# Patient Record
Sex: Female | Born: 1965 | Race: Black or African American | Hispanic: No | Marital: Married | Smoking: Never smoker
Health system: Southern US, Community
[De-identification: ages and names within clinical notes are randomized; demographics above are authoritative.]

## PROBLEM LIST (undated history)

## (undated) DIAGNOSIS — K439 Ventral hernia without obstruction or gangrene: Secondary | ICD-10-CM

## (undated) DIAGNOSIS — D649 Anemia, unspecified: Secondary | ICD-10-CM

## (undated) DIAGNOSIS — D219 Benign neoplasm of connective and other soft tissue, unspecified: Secondary | ICD-10-CM

## (undated) DIAGNOSIS — A3790 Whooping cough, unspecified species without pneumonia: Secondary | ICD-10-CM

## (undated) HISTORY — DX: Whooping cough, unspecified species without pneumonia: A37.90

## (undated) HISTORY — DX: Ventral hernia without obstruction or gangrene: K43.9

## (undated) HISTORY — DX: Benign neoplasm of connective and other soft tissue, unspecified: D21.9

## (undated) HISTORY — PX: COLONOSCOPY: SHX174

## (undated) HISTORY — PX: WISDOM TOOTH EXTRACTION: SHX21

---

## 1999-08-22 ENCOUNTER — Other Ambulatory Visit: Admission: RE | Admit: 1999-08-22 | Discharge: 1999-08-22 | Payer: Self-pay | Admitting: Obstetrics

## 2001-01-01 ENCOUNTER — Other Ambulatory Visit: Admission: RE | Admit: 2001-01-01 | Discharge: 2001-01-01 | Payer: Self-pay | Admitting: Obstetrics

## 2002-03-04 ENCOUNTER — Emergency Department (HOSPITAL_COMMUNITY): Admission: EM | Admit: 2002-03-04 | Discharge: 2002-03-04 | Payer: Self-pay | Admitting: Emergency Medicine

## 2002-03-04 ENCOUNTER — Encounter: Payer: Self-pay | Admitting: Emergency Medicine

## 2003-11-08 ENCOUNTER — Other Ambulatory Visit: Admission: RE | Admit: 2003-11-08 | Discharge: 2003-11-08 | Payer: Self-pay | Admitting: Family Medicine

## 2005-01-10 ENCOUNTER — Other Ambulatory Visit: Admission: RE | Admit: 2005-01-10 | Discharge: 2005-01-10 | Payer: Self-pay | Admitting: Family Medicine

## 2006-08-01 ENCOUNTER — Other Ambulatory Visit: Admission: RE | Admit: 2006-08-01 | Discharge: 2006-08-01 | Payer: Self-pay | Admitting: Family Medicine

## 2007-03-16 ENCOUNTER — Emergency Department (HOSPITAL_COMMUNITY): Admission: EM | Admit: 2007-03-16 | Discharge: 2007-03-16 | Payer: Self-pay | Admitting: Emergency Medicine

## 2007-07-29 ENCOUNTER — Other Ambulatory Visit: Admission: RE | Admit: 2007-07-29 | Discharge: 2007-07-29 | Payer: Self-pay | Admitting: Family Medicine

## 2008-01-28 ENCOUNTER — Encounter: Admission: RE | Admit: 2008-01-28 | Discharge: 2008-01-28 | Payer: Self-pay | Admitting: Gastroenterology

## 2008-10-26 ENCOUNTER — Encounter: Admission: RE | Admit: 2008-10-26 | Discharge: 2008-10-26 | Payer: Self-pay | Admitting: Family Medicine

## 2008-11-08 ENCOUNTER — Other Ambulatory Visit: Admission: RE | Admit: 2008-11-08 | Discharge: 2008-11-08 | Payer: Self-pay | Admitting: Family Medicine

## 2009-10-20 ENCOUNTER — Emergency Department (HOSPITAL_COMMUNITY): Admission: EM | Admit: 2009-10-20 | Discharge: 2009-10-20 | Payer: Self-pay | Admitting: Emergency Medicine

## 2010-01-19 ENCOUNTER — Other Ambulatory Visit: Admission: RE | Admit: 2010-01-19 | Discharge: 2010-01-19 | Payer: Self-pay | Admitting: Family Medicine

## 2011-01-14 ENCOUNTER — Encounter: Payer: Self-pay | Admitting: Family Medicine

## 2011-03-29 LAB — POCT CARDIAC MARKERS
CKMB, poc: 1.3 ng/mL (ref 1.0–8.0)
CKMB, poc: <1 ng/mL — ABNORMAL LOW (ref 1.0–8.0)
Myoglobin, poc: 68.6 ng/mL (ref 12–200)
Troponin i, poc: <0.05 ng/mL (ref 0.00–0.09)
Troponin i, poc: <0.05 ng/mL (ref 0.00–0.09)

## 2011-03-29 LAB — POCT I-STAT, CHEM 8
BUN: 14 mg/dL (ref 6–23)
Calcium, Ion: 1.26 mmol/L (ref 1.12–1.32)
Glucose, Bld: 100 mg/dL — ABNORMAL HIGH (ref 70–99)
Sodium: 140 mEq/L (ref 135–145)

## 2011-03-29 LAB — DIFFERENTIAL
Eosinophils Relative: 3 % (ref 0–5)
Lymphocytes Relative: 49 % — ABNORMAL HIGH (ref 12–46)
Lymphs Abs: 3.7 10*3/uL (ref 0.7–4.0)
Monocytes Relative: 6 % (ref 3–12)
Neutrophils Relative %: 42 % — ABNORMAL LOW (ref 43–77)

## 2011-03-29 LAB — URINALYSIS, ROUTINE W REFLEX MICROSCOPIC
Bilirubin Urine: NEGATIVE
Glucose, UA: NEGATIVE mg/dL
Hgb urine dipstick: NEGATIVE
Ketones, ur: NEGATIVE mg/dL
Nitrite: NEGATIVE
Protein, ur: NEGATIVE mg/dL
Specific Gravity, Urine: 1.015 (ref 1.005–1.030)
pH: 5.5 (ref 5.0–8.0)

## 2011-03-29 LAB — CBC
HCT: 32.5 % — ABNORMAL LOW (ref 36.0–46.0)
Hemoglobin: 10.7 g/dL — ABNORMAL LOW (ref 12.0–15.0)
MCV: 82.5 fL (ref 78.0–100.0)
Platelets: 270 10*3/uL (ref 150–400)

## 2011-04-27 ENCOUNTER — Ambulatory Visit (HOSPITAL_COMMUNITY)
Admission: RE | Admit: 2011-04-27 | Payer: Managed Care, Other (non HMO) | Source: Ambulatory Visit | Admitting: General Surgery

## 2012-08-19 ENCOUNTER — Other Ambulatory Visit: Payer: Self-pay | Admitting: Family Medicine

## 2012-08-19 ENCOUNTER — Other Ambulatory Visit (HOSPITAL_COMMUNITY)
Admission: RE | Admit: 2012-08-19 | Discharge: 2012-08-19 | Disposition: A | Discharge status: Home / Self Care | Payer: Managed Care, Other (non HMO) | Source: Ambulatory Visit | Attending: Family Medicine | Admitting: Family Medicine

## 2012-08-19 DIAGNOSIS — Z0000 Encounter for general adult medical examination without abnormal findings: Secondary | ICD-10-CM | POA: Insufficient documentation

## 2012-09-18 ENCOUNTER — Encounter (HOSPITAL_COMMUNITY): Payer: Self-pay | Admitting: Emergency Medicine

## 2012-09-18 ENCOUNTER — Emergency Department (HOSPITAL_COMMUNITY)
Admission: EM | Admit: 2012-09-18 | Discharge: 2012-09-18 | Disposition: A | Discharge status: Home / Self Care | Payer: Managed Care, Other (non HMO) | Attending: Emergency Medicine | Admitting: Emergency Medicine

## 2012-09-18 ENCOUNTER — Emergency Department (HOSPITAL_COMMUNITY): Payer: Managed Care, Other (non HMO)

## 2012-09-18 DIAGNOSIS — R0602 Shortness of breath: Secondary | ICD-10-CM | POA: Insufficient documentation

## 2012-09-18 DIAGNOSIS — R42 Dizziness and giddiness: Secondary | ICD-10-CM | POA: Insufficient documentation

## 2012-09-18 DIAGNOSIS — R079 Chest pain, unspecified: Secondary | ICD-10-CM | POA: Insufficient documentation

## 2012-09-18 HISTORY — DX: Anemia, unspecified: D64.9

## 2012-09-18 LAB — BASIC METABOLIC PANEL
BUN: 14 mg/dL (ref 6–23)
CO2: 25 mEq/L (ref 19–32)
Calcium: 9 mg/dL (ref 8.4–10.5)
Chloride: 100 mEq/L (ref 96–112)
Creatinine, Ser: 0.66 mg/dL (ref 0.50–1.10)
GFR calc Af Amer: >90 mL/min (ref >90)
GFR calc non Af Amer: >90 mL/min (ref >90)
Glucose, Bld: 99 mg/dL (ref 70–99)
Potassium: 4.1 mEq/L (ref 3.5–5.1)
Sodium: 135 mEq/L (ref 135–145)

## 2012-09-18 LAB — URINALYSIS, ROUTINE W REFLEX MICROSCOPIC
Bilirubin Urine: NEGATIVE
Glucose, UA: NEGATIVE mg/dL
Hgb urine dipstick: NEGATIVE
Ketones, ur: NEGATIVE mg/dL
Leukocytes, UA: NEGATIVE
Nitrite: NEGATIVE
Protein, ur: NEGATIVE mg/dL
Specific Gravity, Urine: 1.02 (ref 1.005–1.030)
Urobilinogen, UA: 0.2 mg/dL (ref 0.0–1.0)
pH: 8 (ref 5.0–8.0)

## 2012-09-18 LAB — CBC
HCT: 34.7 % — ABNORMAL LOW (ref 36.0–46.0)
Hemoglobin: 11.7 g/dL — ABNORMAL LOW (ref 12.0–15.0)
MCH: 26.6 pg (ref 26.0–34.0)
MCHC: 33.7 g/dL (ref 30.0–36.0)
MCV: 78.9 fL (ref 78.0–100.0)
Platelets: 335 10*3/uL (ref 150–400)
RBC: 4.4 MIL/uL (ref 3.87–5.11)
RDW: 12.7 % (ref 11.5–15.5)
WBC: 7.7 10*3/uL (ref 4.0–10.5)

## 2012-09-18 LAB — TROPONIN I: Troponin I: <0.3 ng/mL (ref <0.30)

## 2012-09-18 LAB — PRO B NATRIURETIC PEPTIDE: Pro B Natriuretic peptide (BNP): 8.2 pg/mL (ref 0–125)

## 2012-09-18 NOTE — ED Notes (Signed)
Pt presenting to ed with c/o chest pain onset yesterday morning pt denies nausea and vomiting pt denies radiating pain at this time. Pt states she does have intermittent shortness of breath. Pt states positive dizziness

## 2012-09-18 NOTE — ED Provider Notes (Signed)
History     CSN: [id number]  Arrival date & time 09/18/12  1748   First MD Initiated Contact with Patient 09/18/12 1909      Chief Complaint  Patient presents with  . Chest Pain    (Consider location/radiation/quality/duration/timing/severity/associated sxs/prior treatment) HPI Comments: Patient reports that she has had chest pain since yesterday afternoon.  She reports that the pain is constant.  Pain is located to the left of the sternum in the area of the left breast.  She states that the pain does not radiate.  She describes the pain as a pressure.  She has taken TUMS for her symptoms, which she reports has helped with the pain.  She also reports that the pain improves when she belches.  Nothing makes the pain worse.  She states that she has had intermittent dizziness and shortness of breath with the pain, but only when she really concentrates on the pain.  She denies diaphoresis, numbness, tingling, nausea, or vomiting.  No prior cardiac history.  No history of HTN, hyperlipidemia, or DM.  No FH of cardiac disease. She has never had a cardiac stress test in the past. She denies history of PE or DVT.  Denies any recent surgeries in the past 4 weeks.  No LE edema or pain.  No prolonged travel in the past 4 weeks.  No prior history of cancer.  She is currently not on any estrogen containing medications.    The history is provided by the patient.    Past Medical History  Diagnosis Date  . Anemia     History reviewed. No pertinent past surgical history.  No family history on file.  History  Substance Use Topics  . Smoking status: Never Smoker   . Smokeless tobacco: Not on file  . Alcohol Use: No    OB History    Grav Para Term Preterm Abortions TAB SAB Ect Mult Living                  Review of Systems  Constitutional: Negative for fever, chills and diaphoresis.  Respiratory: Positive for shortness of breath. Negative for cough.   Cardiovascular: Positive for chest pain.  Negative for palpitations and leg swelling.  Gastrointestinal: Negative for nausea, vomiting and abdominal pain.  Skin: Negative for rash.  Neurological: Positive for dizziness. Negative for syncope and numbness.    Allergies  Codeine and Hydrocodone  Home Medications   Current Outpatient Rx  Name Route Sig Dispense Refill  . IBUPROFEN 100 MG PO TABS Oral Take 100 mg by mouth every 6 (six) hours as needed. For pain      BP 114/57  Pulse 80  Temp 97.5 F (36.4 C) (Oral)  Resp 18  SpO2 100%  LMP 08/27/2012  Physical Exam  Nursing note and vitals reviewed. Constitutional: She appears well-developed and well-nourished. No distress.  HENT:  Head: Normocephalic and atraumatic.  Mouth/Throat: Oropharynx is clear and moist.  Neck: Normal range of motion. Neck supple.  Cardiovascular: Normal rate, regular rhythm, normal heart sounds and intact distal pulses.   Pulmonary/Chest: Effort normal and breath sounds normal. No respiratory distress. She has no wheezes. She has no rales.  Abdominal: Soft. There is no tenderness.  Musculoskeletal: Normal range of motion. She exhibits no edema.  Neurological: She is alert.  Skin: Skin is warm and dry. No rash noted. She is not diaphoretic.  Psychiatric: She has a normal mood and affect.    ED Course  Procedures (including critical  care time)  Labs Reviewed  CBC - Abnormal; Notable for the following:    Hemoglobin 11.7 (*)     HCT 34.7 (*)     All other components within normal limits  URINALYSIS, ROUTINE W REFLEX MICROSCOPIC - Abnormal; Notable for the following:    APPearance CLOUDY (*)     All other components within normal limits  BASIC METABOLIC PANEL  PRO B NATRIURETIC PEPTIDE  TROPONIN I  POCT PREGNANCY, URINE   Dg Chest Port 1 View  09/18/2012  *RADIOLOGY REPORT*  Clinical Data: Chest pain  PORTABLE CHEST - 1 VIEW  Comparison: 10/20/2009  Findings: Cardiomediastinal silhouette is stable.  No acute infiltrate or pleural  effusion.  No pulmonary edema.  Bony thorax is stable.  IMPRESSION: No active disease.  No significant change.   Original Report Authenticated By: Natasha Mead, M.D.      No diagnosis found.   Date: 09/18/2012  Rate: 79  Rhythm: normal sinus rhythm  QRS Axis: normal  Intervals: normal  ST/T Wave abnormalities: normal  Conduction Disutrbances:none  Narrative Interpretation:   Old EKG Reviewed: unchanged  Patient discussed with Dr. Juleen China who also evaluated the patient.  MDM  Patient is to be discharged with recommendation to follow up with PCP in regards to today's hospital visit. Chest pain is not likely of cardiac or pulmonary etiology d/t presentation, perc negative, VSS, no new murmur, RRR, breath sounds equal bilaterally, EKG without acute abnormalities, negative troponin, and negative CXR.  Return precautions discussed with patient.  Pt appears reliable for follow up and is agreeable to discharge.  Patient instructed to follow up with PCP to schedule a cardiac stress test.  Case has been discussed with and seen by Dr. Juleen China who agrees with the above plan to discharge.         Pascal Lux [place], PA-C 09/18/12 2141

## 2012-09-18 NOTE — ED Notes (Signed)
PA at bedside.

## 2012-09-21 NOTE — ED Provider Notes (Signed)
Medical screening examination/treatment/procedure(s) were performed by non-physician practitioner and as supervising physician I was immediately available for consultation/collaboration.  Raeford Razor, MD 09/21/12 1025

## 2012-10-07 ENCOUNTER — Other Ambulatory Visit: Payer: Self-pay | Admitting: Family Medicine

## 2012-10-07 DIAGNOSIS — Z1231 Encounter for screening mammogram for malignant neoplasm of breast: Secondary | ICD-10-CM

## 2012-10-08 ENCOUNTER — Ambulatory Visit: Payer: Managed Care, Other (non HMO)

## 2013-01-16 ENCOUNTER — Ambulatory Visit
Admission: RE | Admit: 2013-01-16 | Discharge: 2013-01-16 | Disposition: A | Discharge status: Home / Self Care | Payer: Managed Care, Other (non HMO) | Source: Ambulatory Visit | Attending: Family Medicine | Admitting: Family Medicine

## 2013-01-16 ENCOUNTER — Other Ambulatory Visit: Payer: Self-pay | Admitting: Family Medicine

## 2013-01-16 DIAGNOSIS — N920 Excessive and frequent menstruation with regular cycle: Secondary | ICD-10-CM

## 2014-03-22 ENCOUNTER — Ambulatory Visit (INDEPENDENT_AMBULATORY_CARE_PROVIDER_SITE_OTHER): Payer: Managed Care, Other (non HMO) | Admitting: *Deleted

## 2014-03-22 VITALS — Ht 64.0 in | Wt 177.8 lb

## 2014-03-22 DIAGNOSIS — Z111 Encounter for screening for respiratory tuberculosis: Secondary | ICD-10-CM

## 2014-03-25 ENCOUNTER — Encounter: Payer: Self-pay | Admitting: *Deleted

## 2014-03-25 ENCOUNTER — Ambulatory Visit (INDEPENDENT_AMBULATORY_CARE_PROVIDER_SITE_OTHER): Payer: Managed Care, Other (non HMO) | Admitting: *Deleted

## 2014-03-25 DIAGNOSIS — Z111 Encounter for screening for respiratory tuberculosis: Secondary | ICD-10-CM

## 2014-03-25 LAB — TB SKIN TEST
INDURATION: 0 mm
TB SKIN TEST: NEGATIVE

## 2014-08-23 ENCOUNTER — Ambulatory Visit
Admission: RE | Admit: 2014-08-23 | Discharge: 2014-08-23 | Disposition: A | Discharge status: Home / Self Care | Payer: Managed Care, Other (non HMO) | Source: Ambulatory Visit

## 2014-08-23 ENCOUNTER — Other Ambulatory Visit: Payer: Self-pay

## 2014-08-23 DIAGNOSIS — Z1231 Encounter for screening mammogram for malignant neoplasm of breast: Secondary | ICD-10-CM

## 2014-08-24 ENCOUNTER — Ambulatory Visit: Payer: Managed Care, Other (non HMO)

## 2015-08-30 ENCOUNTER — Other Ambulatory Visit (HOSPITAL_COMMUNITY)
Admission: RE | Admit: 2015-08-30 | Discharge: 2015-08-30 | Disposition: A | Discharge status: Home / Self Care | Payer: Managed Care, Other (non HMO) | Source: Ambulatory Visit | Attending: Family Medicine | Admitting: Family Medicine

## 2015-08-30 ENCOUNTER — Other Ambulatory Visit: Payer: Self-pay | Admitting: Family Medicine

## 2015-08-30 DIAGNOSIS — Z124 Encounter for screening for malignant neoplasm of cervix: Secondary | ICD-10-CM | POA: Insufficient documentation

## 2015-08-31 LAB — CYTOLOGY - PAP

## 2016-03-29 ENCOUNTER — Other Ambulatory Visit: Payer: Self-pay

## 2016-03-29 DIAGNOSIS — Z1231 Encounter for screening mammogram for malignant neoplasm of breast: Secondary | ICD-10-CM

## 2016-04-05 ENCOUNTER — Ambulatory Visit
Admission: RE | Admit: 2016-04-05 | Discharge: 2016-04-05 | Disposition: A | Discharge status: Home / Self Care | Payer: Managed Care, Other (non HMO) | Source: Ambulatory Visit

## 2016-04-05 DIAGNOSIS — Z1231 Encounter for screening mammogram for malignant neoplasm of breast: Secondary | ICD-10-CM

## 2017-03-15 ENCOUNTER — Other Ambulatory Visit: Payer: Self-pay | Admitting: Family Medicine

## 2017-03-15 DIAGNOSIS — Z1231 Encounter for screening mammogram for malignant neoplasm of breast: Secondary | ICD-10-CM

## 2017-04-08 ENCOUNTER — Ambulatory Visit
Admission: RE | Admit: 2017-04-08 | Discharge: 2017-04-08 | Disposition: A | Discharge status: Home / Self Care | Payer: 59 | Source: Ambulatory Visit | Attending: Family Medicine | Admitting: Family Medicine

## 2017-04-08 DIAGNOSIS — Z1231 Encounter for screening mammogram for malignant neoplasm of breast: Secondary | ICD-10-CM

## 2018-02-12 ENCOUNTER — Other Ambulatory Visit: Payer: Self-pay | Admitting: Family Medicine

## 2018-02-12 DIAGNOSIS — N920 Excessive and frequent menstruation with regular cycle: Secondary | ICD-10-CM

## 2018-02-25 ENCOUNTER — Other Ambulatory Visit: Payer: 59

## 2018-03-20 ENCOUNTER — Other Ambulatory Visit: Payer: Self-pay | Admitting: Family Medicine

## 2018-03-20 DIAGNOSIS — Z1231 Encounter for screening mammogram for malignant neoplasm of breast: Secondary | ICD-10-CM

## 2018-04-11 ENCOUNTER — Ambulatory Visit
Admission: RE | Admit: 2018-04-11 | Discharge: 2018-04-11 | Disposition: A | Discharge status: Home / Self Care | Payer: 59 | Source: Ambulatory Visit | Attending: Family Medicine | Admitting: Family Medicine

## 2018-04-11 DIAGNOSIS — Z1231 Encounter for screening mammogram for malignant neoplasm of breast: Secondary | ICD-10-CM

## 2018-10-06 ENCOUNTER — Other Ambulatory Visit (HOSPITAL_COMMUNITY)
Admission: RE | Admit: 2018-10-06 | Discharge: 2018-10-06 | Disposition: A | Discharge status: Home / Self Care | Payer: 59 | Source: Ambulatory Visit | Attending: Family Medicine | Admitting: Family Medicine

## 2018-10-06 ENCOUNTER — Other Ambulatory Visit: Payer: Self-pay | Admitting: Family Medicine

## 2018-10-06 DIAGNOSIS — Z124 Encounter for screening for malignant neoplasm of cervix: Secondary | ICD-10-CM | POA: Insufficient documentation

## 2018-10-08 LAB — CYTOLOGY - PAP
DIAGNOSIS: NEGATIVE
HPV (WINDOPATH): NOT DETECTED

## 2018-11-11 ENCOUNTER — Other Ambulatory Visit: Payer: Self-pay | Admitting: Family Medicine

## 2018-11-11 DIAGNOSIS — K439 Ventral hernia without obstruction or gangrene: Secondary | ICD-10-CM

## 2018-11-15 ENCOUNTER — Ambulatory Visit
Admission: RE | Admit: 2018-11-15 | Discharge: 2018-11-15 | Disposition: A | Discharge status: Home / Self Care | Payer: 59 | Source: Ambulatory Visit | Attending: Family Medicine | Admitting: Family Medicine

## 2018-11-15 DIAGNOSIS — K439 Ventral hernia without obstruction or gangrene: Secondary | ICD-10-CM

## 2018-11-15 MED ORDER — IOPAMIDOL (ISOVUE-300) INJECTION 61%
100.0000 mL | Freq: Once | INTRAVENOUS | Status: AC | PRN
  Administered 2018-11-15: 100 mL via INTRAVENOUS

## 2019-04-21 ENCOUNTER — Other Ambulatory Visit: Payer: Self-pay | Admitting: Family Medicine

## 2019-04-21 DIAGNOSIS — Z1231 Encounter for screening mammogram for malignant neoplasm of breast: Secondary | ICD-10-CM

## 2019-04-23 ENCOUNTER — Telehealth: Payer: Self-pay | Admitting: Physical Medicine & Rehabilitation

## 2019-06-16 ENCOUNTER — Ambulatory Visit
Admission: RE | Admit: 2019-06-16 | Discharge: 2019-06-16 | Disposition: A | Discharge status: Home / Self Care | Payer: 59 | Source: Ambulatory Visit | Attending: Family Medicine | Admitting: Family Medicine

## 2019-06-16 DIAGNOSIS — Z1231 Encounter for screening mammogram for malignant neoplasm of breast: Secondary | ICD-10-CM

## 2020-04-01 ENCOUNTER — Ambulatory Visit: Payer: 59 | Attending: Internal Medicine

## 2020-04-02 ENCOUNTER — Ambulatory Visit: Payer: 59

## 2020-04-28 NOTE — Telephone Encounter (Signed)
Opened in error

## 2020-05-26 ENCOUNTER — Other Ambulatory Visit: Payer: Self-pay | Admitting: Family Medicine

## 2020-05-26 DIAGNOSIS — Z1231 Encounter for screening mammogram for malignant neoplasm of breast: Secondary | ICD-10-CM

## 2020-06-16 ENCOUNTER — Other Ambulatory Visit: Payer: Self-pay

## 2020-06-16 ENCOUNTER — Ambulatory Visit
Admission: RE | Admit: 2020-06-16 | Discharge: 2020-06-16 | Disposition: A | Discharge status: Home / Self Care | Payer: 59 | Source: Ambulatory Visit | Attending: Family Medicine | Admitting: Family Medicine

## 2020-06-16 DIAGNOSIS — Z1231 Encounter for screening mammogram for malignant neoplasm of breast: Secondary | ICD-10-CM

## 2020-06-21 ENCOUNTER — Other Ambulatory Visit: Payer: Self-pay | Admitting: Family Medicine

## 2020-06-21 DIAGNOSIS — R928 Other abnormal and inconclusive findings on diagnostic imaging of breast: Secondary | ICD-10-CM

## 2020-07-06 ENCOUNTER — Ambulatory Visit
Admission: RE | Admit: 2020-07-06 | Discharge: 2020-07-06 | Disposition: A | Discharge status: Home / Self Care | Payer: 59 | Source: Ambulatory Visit | Attending: Family Medicine | Admitting: Family Medicine

## 2020-07-06 ENCOUNTER — Other Ambulatory Visit: Payer: Self-pay

## 2020-07-06 DIAGNOSIS — R928 Other abnormal and inconclusive findings on diagnostic imaging of breast: Secondary | ICD-10-CM

## 2020-07-06 IMAGING — MG DIGITAL DIAGNOSTIC BILAT W/ TOMO W/ CAD
8 series · 8 of 24 positions shown · non-contrast
Comparison: Previous exam(s).

CLINICAL DATA: Screening recall for possible masses in each breast.

EXAM:
DIGITAL DIAGNOSTIC BILATERAL MAMMOGRAM WITH CAD AND TOMO
ULTRASOUND BILATERAL BREAST

[R MLO synth-2D]
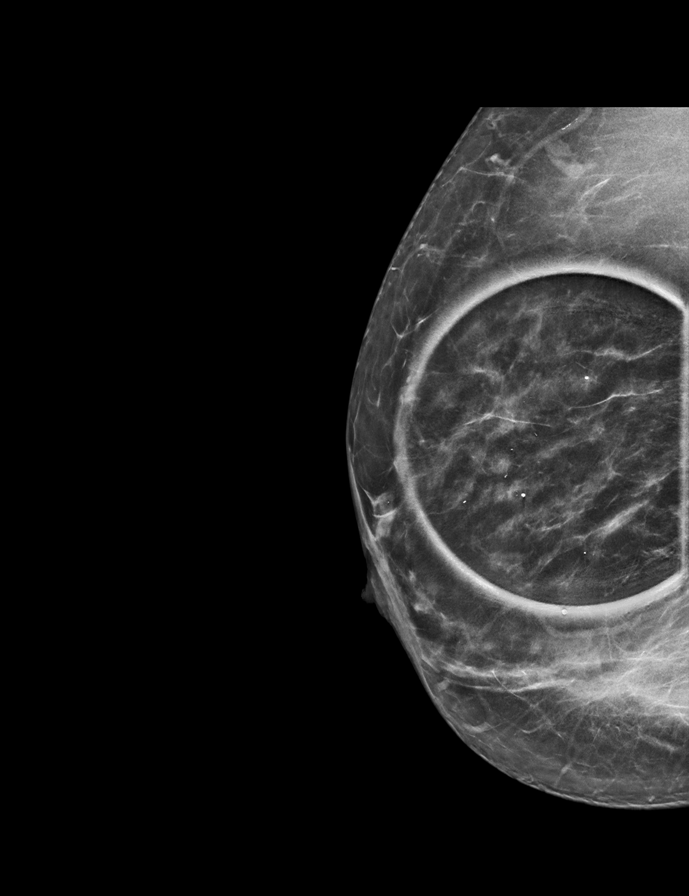

[L CC synth-2D]
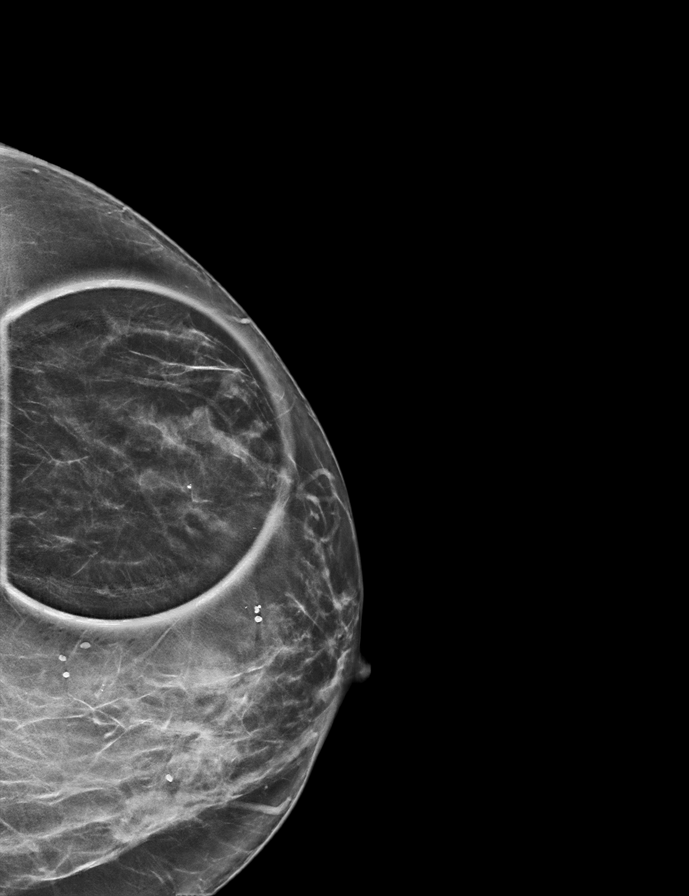

[R CC synth-2D]
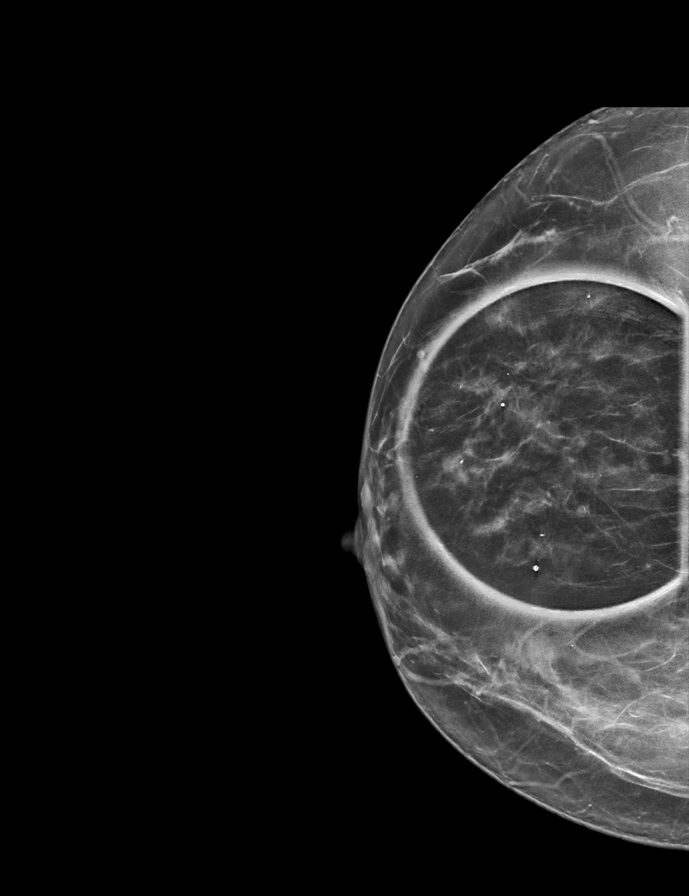

[L MLO synth-2D]
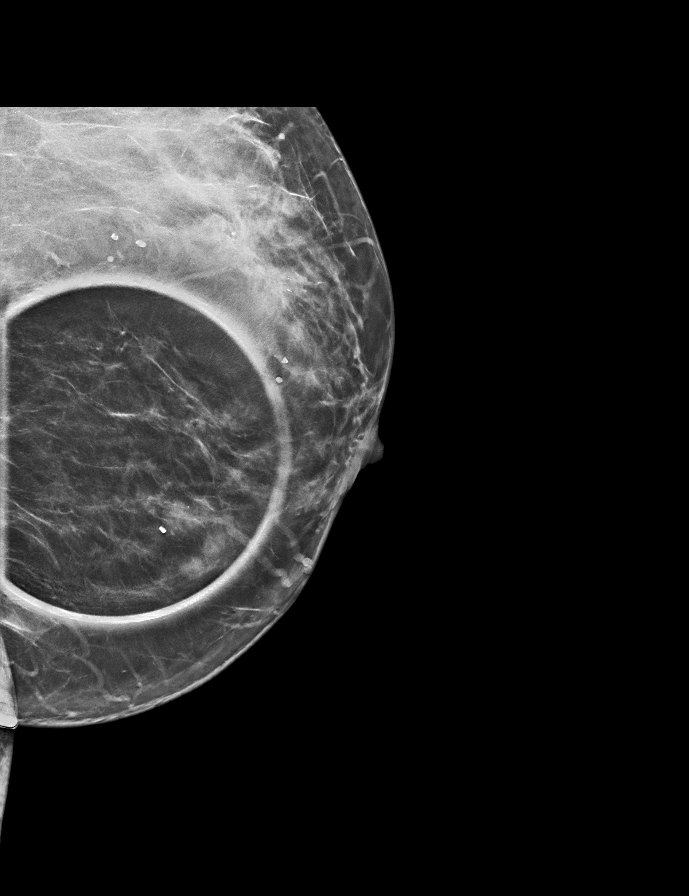

[L MLO tomo · tomo slice 30/59.0]
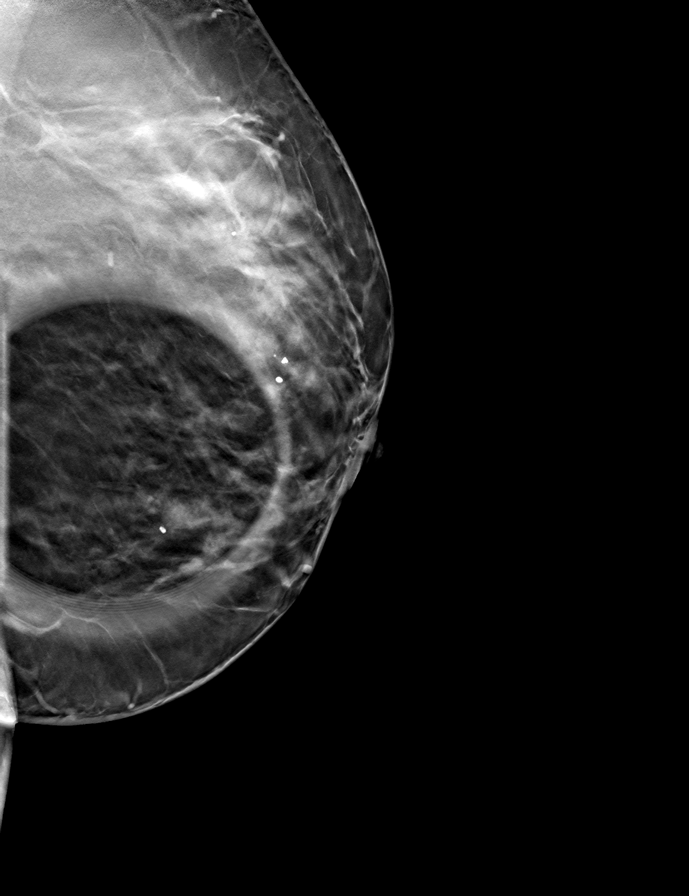

[R MLO tomo · tomo slice 32/63.0]
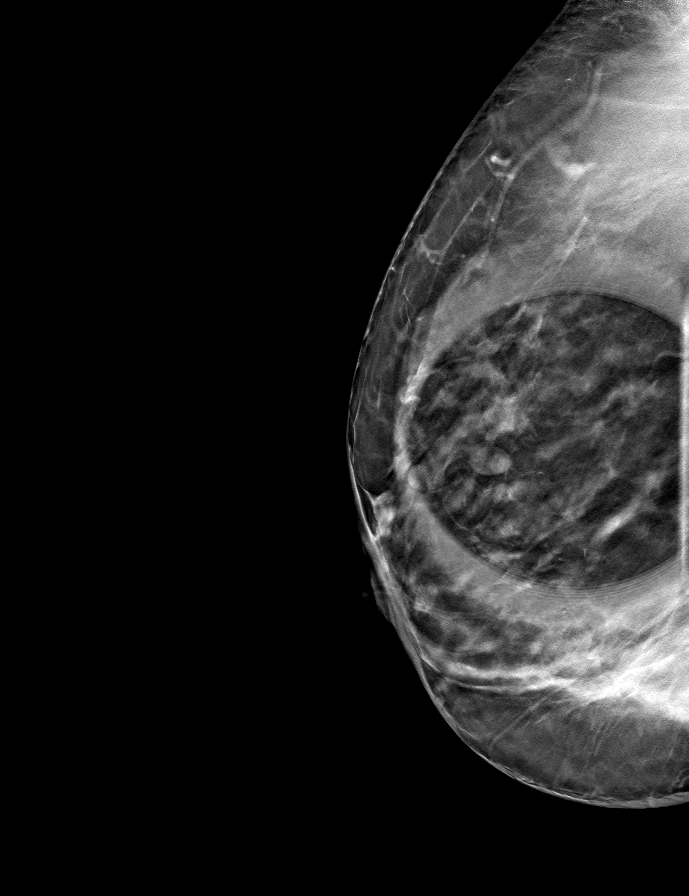

[L CC tomo · tomo slice 31/62.0]
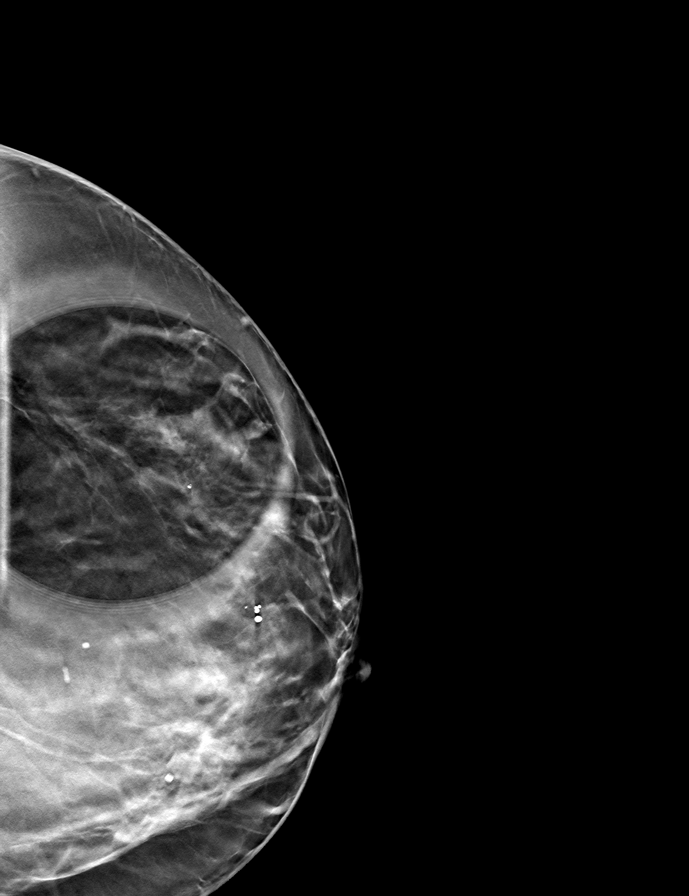

[R CC tomo · tomo slice 30/59.0]
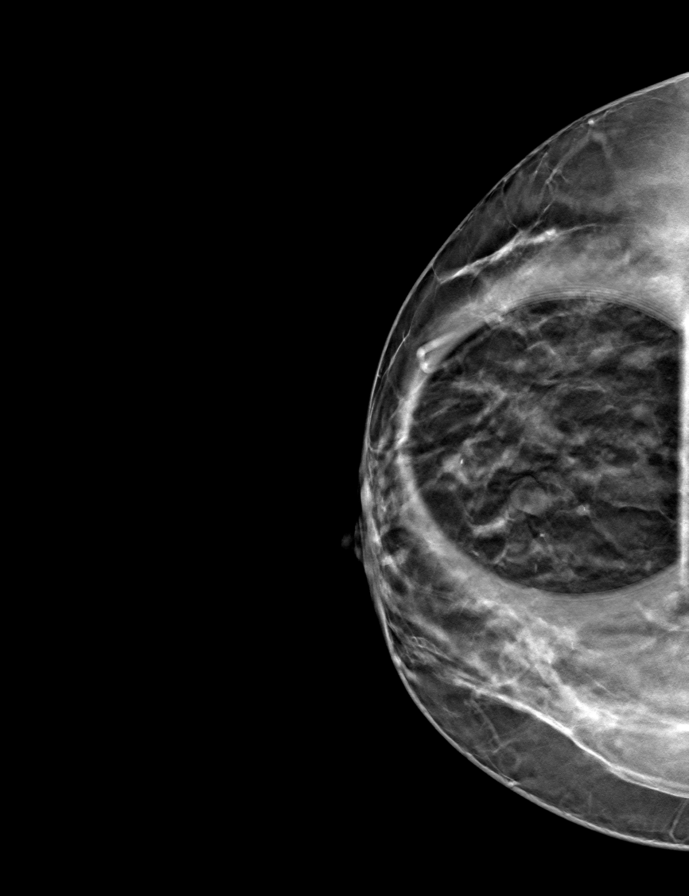

[8 of 24 positions shown; findings below may reference images not displayed]

ACR Breast Density Category c: The breast tissue is heterogeneously
dense, which may obscure small masses.
FINDINGS: In the right breast, the possible mass noted laterally, the anterior
to middle third, persists as a smoothly marginated oval,
approximately 8 mm mass.

In the left breast, possible mass noted laterally persists as a
lobulated, overall oval, mostly circumscribed mass in the
superficial breast, middle third depth.

Mammographic images were processed with CAD.

Targeted right breast ultrasound is performed, showing a dilated
duct that extends to a focal area of dilation at 11 o'clock, 1 cm
from the nipple, measuring 8 x 6 x 7 mm, consistent in size, shape
and location to the mammographic mass. There are no solid masses or
suspicious lesions.

Targeted left breast ultrasound is performed, showing a cluster of
cysts at 3:30 o'clock, 2 cm from the nipple, measuring 1.4 x 0.5 x
1.3 cm, consistent in size, shape and location to the mammographic
mass. There are no solid masses or suspicious lesions.
IMPRESSION: 1. No evidence of breast malignancy.
2. Benign right breast focally dilated duct and left breast cyst.

RECOMMENDATION:
Screening mammogram in one year.(Code:[XT])

I have discussed the findings and recommendations with the patient.
If applicable, a reminder letter will be sent to the patient
regarding the next appointment.

BI-RADS CATEGORY  2: Benign.

## 2020-11-04 ENCOUNTER — Other Ambulatory Visit: Payer: Self-pay | Admitting: Nurse Practitioner

## 2020-11-04 MED ORDER — CIPROFLOXACIN-DEXAMETHASONE 0.3-0.1 % OT SUSP: 4.0000 [drp] | Freq: Two times a day (BID) | OTIC | 0 refills | Status: DC

## 2021-01-12 ENCOUNTER — Other Ambulatory Visit (HOSPITAL_COMMUNITY): Payer: Self-pay | Admitting: Family Medicine

## 2021-01-12 ENCOUNTER — Ambulatory Visit (HOSPITAL_COMMUNITY)
Admission: RE | Admit: 2021-01-12 | Discharge: 2021-01-12 | Disposition: A | Discharge status: Home / Self Care | Payer: 59 | Source: Ambulatory Visit | Attending: Family Medicine | Admitting: Family Medicine

## 2021-01-12 ENCOUNTER — Other Ambulatory Visit: Payer: Self-pay

## 2021-01-12 DIAGNOSIS — R52 Pain, unspecified: Secondary | ICD-10-CM

## 2021-01-13 ENCOUNTER — Encounter (HOSPITAL_COMMUNITY): Payer: Self-pay

## 2021-01-13 ENCOUNTER — Emergency Department (HOSPITAL_COMMUNITY)
Admission: EM | Admit: 2021-01-13 | Discharge: 2021-01-13 | Disposition: A | Discharge status: Home / Self Care | Payer: 59 | Attending: Emergency Medicine | Admitting: Emergency Medicine

## 2021-01-13 ENCOUNTER — Other Ambulatory Visit: Payer: Self-pay

## 2021-01-13 ENCOUNTER — Emergency Department (HOSPITAL_COMMUNITY): Payer: 59

## 2021-01-13 DIAGNOSIS — R1013 Epigastric pain: Secondary | ICD-10-CM | POA: Diagnosis not present

## 2021-01-13 DIAGNOSIS — M791 Myalgia, unspecified site: Secondary | ICD-10-CM | POA: Diagnosis not present

## 2021-01-13 DIAGNOSIS — R0981 Nasal congestion: Secondary | ICD-10-CM | POA: Diagnosis not present

## 2021-01-13 DIAGNOSIS — J029 Acute pharyngitis, unspecified: Secondary | ICD-10-CM | POA: Diagnosis not present

## 2021-01-13 DIAGNOSIS — Z8616 Personal history of COVID-19: Secondary | ICD-10-CM | POA: Diagnosis not present

## 2021-01-13 DIAGNOSIS — R519 Headache, unspecified: Secondary | ICD-10-CM | POA: Diagnosis not present

## 2021-01-13 DIAGNOSIS — R509 Fever, unspecified: Secondary | ICD-10-CM | POA: Insufficient documentation

## 2021-01-13 DIAGNOSIS — R079 Chest pain, unspecified: Secondary | ICD-10-CM | POA: Diagnosis present

## 2021-01-13 DIAGNOSIS — R0789 Other chest pain: Secondary | ICD-10-CM | POA: Insufficient documentation

## 2021-01-13 DIAGNOSIS — R0602 Shortness of breath: Secondary | ICD-10-CM | POA: Diagnosis not present

## 2021-01-13 LAB — BASIC METABOLIC PANEL
Anion gap: 8 (ref 5–15)
BUN: 17 mg/dL (ref 6–20)
CO2: 28 mmol/L (ref 22–32)
Calcium: 9.8 mg/dL (ref 8.9–10.3)
Chloride: 103 mmol/L (ref 98–111)
Creatinine, Ser: 0.78 mg/dL (ref 0.44–1.00)
GFR, Estimated: >60 mL/min (ref >60)
Glucose, Bld: 103 mg/dL — ABNORMAL HIGH (ref 70–99)
Potassium: 4.8 mmol/L (ref 3.5–5.1)
Sodium: 139 mmol/L (ref 135–145)

## 2021-01-13 LAB — TROPONIN I (HIGH SENSITIVITY): Troponin I (High Sensitivity): <2 ng/L (ref <18)

## 2021-01-13 LAB — CBC
HCT: 37.9 % (ref 36.0–46.0)
Hemoglobin: 12.1 g/dL (ref 12.0–15.0)
MCH: 26.3 pg (ref 26.0–34.0)
MCHC: 31.9 g/dL (ref 30.0–36.0)
MCV: 82.4 fL (ref 80.0–100.0)
Platelets: 340 10*3/uL (ref 150–400)
RBC: 4.6 MIL/uL (ref 3.87–5.11)
RDW: 12.7 % (ref 11.5–15.5)
WBC: 7.8 10*3/uL (ref 4.0–10.5)
nRBC: 0 % (ref 0.0–0.2)

## 2021-01-13 LAB — I-STAT BETA HCG BLOOD, ED (MC, WL, AP ONLY): I-stat hCG, quantitative: <5 m[IU]/mL (ref <5)

## 2021-01-13 LAB — D-DIMER, QUANTITATIVE: D-Dimer, Quant: <0.27 ug/mL-FEU (ref 0.00–0.50)

## 2021-01-13 MED ORDER — LIDOCAINE VISCOUS HCL 2 % MT SOLN
15.0000 mL | Freq: Once | OROMUCOSAL | Status: AC
  Administered 2021-01-13: 15 mL via ORAL
  Filled 2021-01-13: qty 15

## 2021-01-13 MED ORDER — ALUM & MAG HYDROXIDE-SIMETH 200-200-20 MG/5ML PO SUSP
30.0000 mL | Freq: Once | ORAL | Status: AC
  Administered 2021-01-13: 30 mL via ORAL
  Filled 2021-01-13: qty 30

## 2021-01-13 NOTE — ED Provider Notes (Signed)
[facility] DEPT Provider Note   CSN: [id number] Arrival date & time: 01/13/21  1709     History Chief Complaint  Patient presents with  . Chest Pain    Gabrielle Berg is a 55 y.o. female.  The history is provided by the patient and medical records. No language interpreter was used.  Chest Pain    55 year old female significant history of anemia, recently diagnosed with COVID infection approximate 10 days ago presenting complaining of chest pain.  Patient reports she was diagnosed with COVID approximately 7 days ago.  Since the onset of her symptoms she has been having intermittent chest pain.  She described as a nagging sensation to her chest.  Pain is waxing and waning, sometimes taking a deep breath actually helps.  At time he could be reproducible.  Occasionally endorse some mild shortness of breath.  She did have fever chills body aches nasal congestion headache sore throat but most of those symptoms have since improved.  She admits to taking over-the-counter medication including ibuprofen twice daily as well as cough and cold medication.  She she mention most of the symptom has resolved except for her chest discomfort.  She was seen by her PCP yesterday for her complaints, she reported EKG was obtained as well as as a chest x-ray and she was told that it was normal.  She was prescribed omeprazole for 7 days.  She did take the medication which provide some improvement.  Today she noticed some discomfort in her left arm and decided to come here for evaluation.  She denies any prior history of PE or DVT.  She denies any strong family history of premature cardiac death.  No complaints of drug or alcohol use.  She was vaccinated for COVID-19 but have not had her booster.   Past Medical History:  Diagnosis Date  . Anemia   . Anemia   . Anemia   . Fibroid    small  fibroid on utrasound 1/14  . Pertussis    as a child  . Ventral hernia    Dr Barkley Bruns     There are no problems to display for this patient.   Past Surgical History:  Procedure Laterality Date  . WISDOM TOOTH EXTRACTION       OB History   No obstetric history on file.     Family History  Problem Relation Age of Onset  . Breast cancer Maternal Aunt 77    Social History   Tobacco Use  . Smoking status: Never Smoker  Substance Use Topics  . Alcohol use: No  . Drug use: No    Home Medications Prior to Admission medications   Medication Sig Start Date End Date Taking? Authorizing Provider  ciprofloxacin-dexamethasone (CIPRODEX) OTIC suspension Place 4 drops into the left ear 2 (two) times daily. 11/04/20   Vevelyn Francois, NP  ibuprofen (ADVIL,MOTRIN) 100 MG tablet Take 100 mg by mouth every 6 (six) hours as needed. For pain    [provider]    Allergies    Codeine and Hydrocodone  Review of Systems   Review of Systems  Cardiovascular: Positive for chest pain.  All other systems reviewed and are negative.   Physical Exam Updated Vital Signs BP (!) 152/86 (BP Location: Right Arm)   Pulse 88   Temp 98.2 F (36.8 C) (Oral)   Resp 17   SpO2 98%   Physical Exam Vitals and nursing note reviewed.  Constitutional:  General: She is not in acute distress.    Appearance: She is well-developed and well-nourished.     Comments: Patient laying in bed appears to be in no acute discomfort.  Speaks in complete sentences.  HENT:     Head: Atraumatic.  Eyes:     Conjunctiva/sclera: Conjunctivae normal.  Cardiovascular:     Rate and Rhythm: Normal rate and regular rhythm.     Heart sounds: Normal heart sounds. No murmur heard. No friction rub. No gallop.   Pulmonary:     Effort: Pulmonary effort is normal.     Breath sounds: Normal breath sounds. No wheezing, rhonchi or rales.  Chest:     Chest wall: Tenderness (Mild left anterior chest wall tenderness on palpation no crepitus emphysema noted.  No rash.) present.  Abdominal:      Palpations: Abdomen is soft.     Tenderness: There is no abdominal tenderness.  Musculoskeletal:     Cervical back: Neck supple.     Right lower leg: No edema.     Left lower leg: No edema.  Skin:    Findings: No rash.  Neurological:     Mental Status: She is alert and oriented to person, place, and time.  Psychiatric:        Mood and Affect: Mood and affect and mood normal.     ED Results / Procedures / Treatments   Labs (all labs ordered are listed, but only abnormal results are displayed) Labs Reviewed  BASIC METABOLIC PANEL - Abnormal; Notable for the following components:      Result Value   Glucose, Bld 103 (*)    All other components within normal limits  CBC  D-DIMER, QUANTITATIVE (NOT AT [facility])  I-STAT BETA HCG BLOOD, ED ([facility], [facility], [facility] ONLY)  TROPONIN I (HIGH SENSITIVITY)    EKG None  ED ECG REPORT   Date: 01/13/2021  Rate: 85  Rhythm: normal sinus rhythm  QRS Axis: left  Intervals: normal  ST/T Wave abnormalities: normal  Conduction Disutrbances:abnormal R wave progression, early transition.  LVH  Narrative Interpretation:   Old EKG Reviewed: unchanged  I have personally reviewed the EKG tracing and agree with the computerized printout as noted.   Radiology DG Chest 2 View  Result Date: 01/13/2021 CLINICAL DATA:  Chest pain for several days, pain radiating to left shoulder EXAM: CHEST - 2 VIEW COMPARISON:  01/12/2021 FINDINGS: Frontal and lateral views of the chest demonstrate an unremarkable cardiac silhouette. No airspace disease, effusion, or pneumothorax. No acute bony abnormalities. IMPRESSION: 1. No acute intrathoracic process. Electronically Signed   By: Randa Ngo M.D.   On: 01/13/2021 18:27   DG Chest 2 View  Result Date: 01/12/2021 CLINICAL DATA:  Chest pain EXAM: CHEST - 2 VIEW COMPARISON:  09/18/2012 FINDINGS: The heart size and mediastinal contours are within normal limits. Both lungs are clear. The visualized skeletal structures are  unremarkable. IMPRESSION: No active cardiopulmonary disease. Electronically Signed   By: Donavan Foil M.D.   On: 01/12/2021 19:53    Procedures Procedures (including critical care time)  Medications Ordered in ED Medications  alum & mag hydroxide-simeth (MAALOX/MYLANTA) 200-200-20 MG/5ML suspension 30 mL (30 mLs Oral Given 01/13/21 1830)    And  lidocaine (XYLOCAINE) 2 % viscous mouth solution 15 mL (15 mLs Oral Given 01/13/21 1830)    ED Course  I have reviewed the triage vital signs and the nursing notes.  Pertinent labs & imaging results that were available during my care of  the patient were reviewed by me and considered in my medical decision making (see chart for details).    MDM Rules/Calculators/A&P                          BP 137/85 (BP Location: Left Arm)   Pulse 77   Temp 98.1 F (36.7 C) (Oral)   Resp 18   SpO2 100%  Final Clinical Impression(s) / ED Diagnoses Final diagnoses:  Epigastric pain    Rx / DC Orders ED Discharge Orders    None     6:11 PM Patient recently had COVID infection is not is here complaining of chest discomfort.  Her pain is atypical of ACS.  Pain is nonexertional and she does not have any friction rub or EKG finding or troponin findings to suggest pericarditis or myocarditis.  She does voice concern for potential PE therefore D-dimer ordered as patient cannot be ruled out using PERC criteria.  I suspect her symptoms may be due to gastritis from persistent NSAIDs use or during her sickness.  GI cocktail given.  She was worried about her elevated blood pressure.  Blood pressure is 153/86.  Suspect this may be secondary to over-the-counter cough and cold medication which may include decongestant.  7:42 PM Labs are reassuring, negative D-dimer, low suspicion for PE, troponin is negative, chest x-ray unremarkable, essentially everything else seems to be within normal limit.  After receiving GI cocktail patient felt much better.  At this time she  is stable for discharge.  Encourage patient to take Prilosec as previously prescribed.  Return precaution given.   Domenic Moras, PA-C 01/13/21 2050    Drenda Freeze, MD 01/13/21 [phone]

## 2021-01-13 NOTE — Discharge Instructions (Addendum)
Your pain discomfort is likely due to irritation of the stomach lining from regular use of NSAIDs.  I would recommend taking Prilosec as previously prescribed.  Your elevated blood pressure may be due to over-the-counter cold and cough medication that contains decongestion.  I anticipate your blood pressure will improve with time.  Recommendations for at home COVID-19 symptoms management:  Please continue isolation at home. Call [phone] to see whether you might be eligible for therapeutic antibody infusions if your symptoms are within 5-7 days. (leave your name and they will call you back).  If have acute worsening of symptoms please go to ER/urgent care for further evaluation. Check pulse oximetry and if below 90-92% please go to ER. The following supplements MAY help:  Vitamin C 500mg  twice a day and Quercetin 250-500 mg twice a day Vitamin D3 2000 - 4000 u/day B Complex vitamins Zinc 75-100 mg/day Melatonin 6-10 mg at night (the optimal dose is unknown)

## 2021-01-13 NOTE — ED Triage Notes (Signed)
Pt reports chest pain for a few days. Pt reports the pain now radiates to left shoulder and is also experiencing some numbness to that arm. Pt reports going to PCP yesterday and they did an EKG and chest x-ray with no abnormalities. Pt reports she tested positive for COVID on 1/10 and experienced [facility] after that, but is not reporting [facility] today.

## 2021-01-18 ENCOUNTER — Encounter: Payer: Self-pay | Admitting: Cardiovascular Disease

## 2021-01-18 ENCOUNTER — Ambulatory Visit (INDEPENDENT_AMBULATORY_CARE_PROVIDER_SITE_OTHER): Payer: 59 | Admitting: Cardiovascular Disease

## 2021-01-18 ENCOUNTER — Other Ambulatory Visit: Payer: Self-pay

## 2021-01-18 VITALS — BP 116/80 | HR 93 | Ht 64.0 in | Wt 188.0 lb

## 2021-01-18 DIAGNOSIS — R0789 Other chest pain: Secondary | ICD-10-CM | POA: Diagnosis not present

## 2021-01-18 DIAGNOSIS — R002 Palpitations: Secondary | ICD-10-CM

## 2021-01-18 DIAGNOSIS — R0602 Shortness of breath: Secondary | ICD-10-CM

## 2021-01-18 NOTE — Addendum Note (Signed)
Addended by: Wonda Horner on: 01/18/2021 12:56 PM   Modules accepted: Orders

## 2021-01-18 NOTE — Patient Instructions (Signed)
Medication Instructions:  The current medical regimen is effective;  continue present plan and medications.  *If you need a refill on your cardiac medications before your next appointment, please call your pharmacy*    Follow-Up: At [facility], you and your health needs are our priority.  As part of our continuing mission to provide you with exceptional heart care, we have created designated Provider Care Teams.  These Care Teams include your primary Cardiologist (physician) and Advanced Practice Providers (APPs -  Physician Assistants and Nurse Practitioners) who all work together to provide you with the care you need, when you need it.  We recommend signing up for the patient portal called "MyChart".  Sign up information is provided on this After Visit Summary.  MyChart is used to connect with patients for Virtual Visits (Telemedicine).  Patients are able to view lab/test results, encounter notes, upcoming appointments, etc.  Non-urgent messages can be sent to your provider as well.   To learn more about what you can do with MyChart, go to [email or web address].    Your next appointment:   As needed  The format for your next appointment:   In Person  Provider:   Danielson O'Neal, MD      

## 2021-01-18 NOTE — Progress Notes (Signed)
Cardiology Office Note:   Date:  01/18/2021  NAME:  Gabrielle Berg    MRN: [id number] DOB:  02-28-1966   PCP:  Harlan Stains, MD  Cardiologist:  No primary care provider on file.   Referring MD: Harlan Stains, MD   Chief Complaint  Patient presents with  . Chest Pain   History of Present Illness:   Gabrielle Berg is a 55 y.o. female with a hx of anemia who is being seen today for the evaluation of CP/SOB/tachycardia at the request of Harlan Stains, MD. Evaluated in the ER 1.21.2022 for above symptoms. EKG normal. Troponin <2. CXR normal.  She reports she had COVID-19 on 01/03/2021.  Symptoms included sore throat, cough, congestion, body aches and shortness of breath.  She also developed sharp burning sensation in her chest.  She was evaluated emergency room and given Pepcid.  This has improved her symptoms.  She describes the pain as a burning sensation in her chest.  Can occur anytime.  It is alleviated by burping as well as heartburn medications.  She reports has been on Prilosec for roughly 7 days and symptoms have all but resolved.  I encouraged her to continue to take this medication.  She reports since coronavirus she has been short of breath with activity.  She is also noticed her heart rate goes up.  Symptoms occur daily and with heavy exertion.  Apparently symptoms are improving.  She has noticed her heart rate can go up in the 120s but she does not feel any palpitations that are bothersome.  It is more than short of breath that bothers her.  She has 2 chest x-rays that are normal.  Her EKG today in office demonstrates normal sinus rhythm heart rate 93 with early repolarization abnormality.  There are no acute ischemic findings.  She is very healthy.  She is never had a heart attack or stroke.  She does not smoke, drink alcohol or use drugs.  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.  Family history significant for hypertension.  She reports she is not been exercising really  but has no exertional chest pain symptoms.  Shortness of breath is improving.  We did discuss going to the COVID-19 clinic in pursuing inhalers.  She reports symptoms are improving and she is glad of this.  She did screen negative for any myocardial damage with a negative high-sensitivity troponin.  Overall things are improving.  Labs from primary care physician demonstrate total cholesterol 137, HDL 60, LDL 60, triglycerides 97, hemoglobin 12.1, creatinine 0.78, TSH 0.59  Past Medical History: Past Medical History:  Diagnosis Date  . Anemia   . Anemia   . Anemia   . Fibroid    small  fibroid on utrasound 1/14  . Pertussis    as a child  . Ventral hernia    Dr Barkley Bruns    Past Surgical History: Past Surgical History:  Procedure Laterality Date  . WISDOM TOOTH EXTRACTION      Current Medications: Current Meds  Medication Sig  . [DISCONTINUED] ciprofloxacin-dexamethasone (CIPRODEX) OTIC suspension Place 4 drops into the left ear 2 (two) times daily.  . [DISCONTINUED] ibuprofen (ADVIL,MOTRIN) 100 MG tablet Take 100 mg by mouth every 6 (six) hours as needed. For pain     Allergies:    Codeine and Hydrocodone   Social History: Social History   Socioeconomic History  . Marital status: Married    Spouse name: Not on file  . Number of children:  3  . Years of education: Not on file  . Highest education level: Not on file  Occupational History  . Occupation: Designer, jewellery   Tobacco Use  . Smoking status: Never Smoker  . Smokeless tobacco: Never Used  Substance and Sexual Activity  . Alcohol use: No  . Drug use: No  . Sexual activity: Yes  Other Topics Concern  . Not on file  Social History Narrative  . Not on file   Social Determinants of Health   Financial Resource Strain: Not on file  Food Insecurity: Not on file  Transportation Needs: Not on file  Physical Activity: Not on file  Stress: Not on file  Social Connections: Not on file    Family  History: The patient's family history includes Breast cancer (age of onset: 23) in her maternal aunt; Hypertension in her brother and sister.  ROS:   All other ROS reviewed and negative. Pertinent positives noted in the HPI.     EKGs/Labs/Other Studies Reviewed:   The following studies were personally reviewed by me today:  EKG:  EKG is ordered today.  The ekg ordered today demonstrates normal sinus rhythm heart rate 93, no acute ischemic changes, minimal ST elevation which is likely early repolarization, and was personally reviewed by me.   Recent Labs: 01/13/2021: BUN 17; Creatinine, Ser 0.78; Hemoglobin 12.1; Platelets 340; Potassium 4.8; Sodium 139   Recent Lipid Panel No results found for: CHOL, TRIG, HDL, CHOLHDL, VLDL, LDLCALC, LDLDIRECT  Physical Exam:   VS:  BP 116/80   Pulse 93   Ht 5\' 4"  (1.626 m)   Wt 188 lb (85.3 kg)   SpO2 98%   BMI 32.27 kg/m    Wt Readings from Last 3 Encounters:  01/18/21 188 lb (85.3 kg)  03/22/14 177 lb 12.8 oz (80.6 kg)    General: Well nourished, well developed, in no acute distress Head: Atraumatic, normal size  Eyes: PEERLA, EOMI  Neck: Supple, no JVD Endocrine: No thryomegaly Cardiac: Normal S1, S2; RRR; no murmurs, rubs, or gallops Lungs: Clear to auscultation bilaterally, no wheezing, rhonchi or rales  Abd: Soft, nontender, no hepatomegaly  Ext: No edema, pulses 2+ Musculoskeletal: No deformities, BUE and BLE strength normal and equal Skin: Warm and dry, no rashes   Neuro: Alert and oriented to person, place, time, and situation, CNII-XII grossly intact, no focal deficits  Psych: Normal mood and affect   ASSESSMENT:   Gabrielle Berg is a 55 y.o. female who presents for the following: 1. Other chest pain   2. SOB (shortness of breath) on exertion   3. Palpitations     PLAN:   1. Other chest pain -Burning sensation in her chest.  Alleviated by burping.  Prilosec has improved this.  I think it is safe to say her symptoms are  GERD related.  Recent troponins negative in the emergency room.  EKG with no acute ischemic changes and evidence of early repolarization abnormality.  This is a normal finding.  Overall I have a low suspicion for cardiac testing.  Her cardiovascular examination is normal.  I think this is heartburn.  She will continue her Prilosec medication.  2. SOB (shortness of breath) on exertion 3. Palpitations -Shortness of breath and increased heart rate with exertion.  Symptoms occurred after coronavirus.  EKG normal.  Troponins negative.  She is screened negative for any myocardial involvement.  Cardiovascular examination is normal.  I see no need for an echocardiogram.  Symptoms are gradually improving.  Chest x-ray is clear.  Lungs are clear.  I think she just has residual Covid symptoms that are improving.  We discussed pursuing a heart monitor to make sure there is no arrhythmia.  Given that her heart rate is starting to improve she reports she would like to just wait on this for now.  Should symptoms worsen we will reevaluate her need for heart monitor.  At this time everything is looking very good and symptoms are improving.  I see no need for further cardiac testing.  She never had any significant chest pain symptoms that were bothersome.  She had acid reflux symptoms as detailed above.   Disposition: Return if symptoms worsen or fail to improve.  Medication Adjustments/Labs and Tests Ordered: Current medicines are reviewed at length with the patient today.  Concerns regarding medicines are outlined above.  No orders of the defined types were placed in this encounter.  No orders of the defined types were placed in this encounter.   Patient Instructions  Medication Instructions:  The current medical regimen is effective;  continue present plan and medications.  *If you need a refill on your cardiac medications before your next appointment, please call your pharmacy*  Follow-Up: At [facility], you and your health needs are our priority.  As part of our continuing mission to provide you with exceptional heart care, we have created designated Provider Care Teams.  These Care Teams include your primary Cardiologist (physician) and Advanced Practice Providers (APPs -  Physician Assistants and Nurse Practitioners) who all work together to provide you with the care you need, when you need it.  We recommend signing up for the patient portal called "MyChart".  Sign up information is provided on this After Visit Summary.  MyChart is used to connect with patients for Virtual Visits (Telemedicine).  Patients are able to view lab/test results, encounter notes, upcoming appointments, etc.  Non-urgent messages can be sent to your provider as well.   To learn more about what you can do with MyChart, go to [email or web address].    Your next appointment:   As needed  The format for your next appointment:   In Person  Provider:   Eleonore Chiquito, MD         Signed, Addison Naegeli. Audie Box, Page  [address], [place], [place] [address] [phone]  01/18/2021 10:23 AM

## 2021-05-25 ENCOUNTER — Other Ambulatory Visit: Payer: Self-pay | Admitting: Family Medicine

## 2021-05-25 DIAGNOSIS — R109 Unspecified abdominal pain: Secondary | ICD-10-CM

## 2021-06-15 ENCOUNTER — Ambulatory Visit
Admission: RE | Admit: 2021-06-15 | Discharge: 2021-06-15 | Disposition: A | Discharge status: Home / Self Care | Payer: 59 | Source: Ambulatory Visit | Attending: Family Medicine | Admitting: Family Medicine

## 2021-06-15 ENCOUNTER — Other Ambulatory Visit: Payer: Self-pay | Admitting: Family Medicine

## 2021-06-15 DIAGNOSIS — Z1231 Encounter for screening mammogram for malignant neoplasm of breast: Secondary | ICD-10-CM

## 2021-06-15 DIAGNOSIS — R109 Unspecified abdominal pain: Secondary | ICD-10-CM

## 2021-06-23 ENCOUNTER — Ambulatory Visit: Payer: 59

## 2021-06-27 ENCOUNTER — Ambulatory Visit
Admission: RE | Admit: 2021-06-27 | Discharge: 2021-06-27 | Disposition: A | Discharge status: Home / Self Care | Payer: 59 | Source: Ambulatory Visit | Attending: Family Medicine | Admitting: Family Medicine

## 2021-06-27 ENCOUNTER — Other Ambulatory Visit: Payer: Self-pay

## 2021-06-27 DIAGNOSIS — Z1231 Encounter for screening mammogram for malignant neoplasm of breast: Secondary | ICD-10-CM

## 2021-08-18 ENCOUNTER — Ambulatory Visit: Payer: 59

## 2021-11-18 ENCOUNTER — Other Ambulatory Visit: Payer: Self-pay

## 2021-11-18 ENCOUNTER — Encounter (HOSPITAL_BASED_OUTPATIENT_CLINIC_OR_DEPARTMENT_OTHER): Payer: Self-pay | Admitting: *Deleted

## 2021-11-18 ENCOUNTER — Emergency Department (HOSPITAL_BASED_OUTPATIENT_CLINIC_OR_DEPARTMENT_OTHER)
Admission: EM | Admit: 2021-11-18 | Discharge: 2021-11-18 | Disposition: A | Discharge status: Home / Self Care | Payer: 59 | Attending: Emergency Medicine | Admitting: Emergency Medicine

## 2021-11-18 ENCOUNTER — Emergency Department (HOSPITAL_BASED_OUTPATIENT_CLINIC_OR_DEPARTMENT_OTHER): Payer: 59

## 2021-11-18 DIAGNOSIS — R0789 Other chest pain: Secondary | ICD-10-CM | POA: Insufficient documentation

## 2021-11-18 DIAGNOSIS — R079 Chest pain, unspecified: Secondary | ICD-10-CM

## 2021-11-18 LAB — BASIC METABOLIC PANEL
Anion gap: 8 (ref 5–15)
BUN: 15 mg/dL (ref 6–20)
CO2: 25 mmol/L (ref 22–32)
Calcium: 9 mg/dL (ref 8.9–10.3)
Chloride: 104 mmol/L (ref 98–111)
Creatinine, Ser: 0.77 mg/dL (ref 0.44–1.00)
GFR, Estimated: >60 mL/min (ref >60)
Glucose, Bld: 89 mg/dL (ref 70–99)
Potassium: 4 mmol/L (ref 3.5–5.1)
Sodium: 137 mmol/L (ref 135–145)

## 2021-11-18 LAB — CBC
HCT: 36.1 % (ref 36.0–46.0)
Hemoglobin: 11.6 g/dL — ABNORMAL LOW (ref 12.0–15.0)
MCH: 25.8 pg — ABNORMAL LOW (ref 26.0–34.0)
MCHC: 32.1 g/dL (ref 30.0–36.0)
MCV: 80.4 fL (ref 80.0–100.0)
Platelets: 309 10*3/uL (ref 150–400)
RBC: 4.49 MIL/uL (ref 3.87–5.11)
RDW: 12.9 % (ref 11.5–15.5)
WBC: 6.3 10*3/uL (ref 4.0–10.5)
nRBC: 0 % (ref 0.0–0.2)

## 2021-11-18 LAB — TROPONIN I (HIGH SENSITIVITY)
Troponin I (High Sensitivity): <2 ng/L (ref <18)
Troponin I (High Sensitivity): <2 ng/L (ref <18)

## 2021-11-18 MED ORDER — FAMOTIDINE 20 MG PO TABS: 20.0000 mg | ORAL_TABLET | Freq: Two times a day (BID) | ORAL | 0 refills | Status: DC

## 2021-11-18 NOTE — Discharge Instructions (Addendum)
You were evaluated in the Emergency Department and after careful evaluation, we did not find any emergent condition requiring admission or further testing in the hospital.  Your exam/testing today was overall reassuring.  Your cardiac troponins were both negative and your EKG was reassuring.  Your chest x-ray showed mild atelectasis on the right but no acute cardiac or pulmonary abnormalities.  You are risk factors are low for an adverse cardiac event in the next 30 days.  Recommend you follow-up outpatient as needed with your cardiologist for cardiac stress testing as indicated.  Symptoms may be due to GERD.  Will trial Pepcid and have you follow-up with your PCP  Please return to the Emergency Department if you experience any worsening of your condition.  Thank you for allowing [place] to be a part of your care.

## 2021-11-18 NOTE — ED Triage Notes (Signed)
Onset of chest pain approx midnight, no hx of GERD, also had left arm tightness, was preparing for bedtime. Ant left breast, soreness with left arm radiation. Denies dizziness, nausea or vomiting.

## 2021-11-18 NOTE — ED Provider Notes (Addendum)
[facility] EMERGENCY DEPARTMENT Provider Note   CSN: [id number] Arrival date & time: 11/18/21  1032     History Chief Complaint  Patient presents with   Chest Pain    Gabrielle Berg is a 55 y.o. female.   Chest Pain Associated symptoms: no abdominal pain, no cough, no fever, no nausea, no palpitations, no shortness of breath and no vomiting    55 year old female with medical history significant for anemia presenting to the emergency department with chest pain that began approximately midnight last night.  The patient was preparing for bedtime when she endorsed epigastric/left-sided chest discomfort with some left arm tightness.  She denied any nausea, vomiting, lightheadedness.  She denied any palpitations.  She denies any shortness of breath.  Symptoms persisted this morning so she presented to the emergency department for further evaluation.  She denies any infectious symptoms.  Past Medical History:  Diagnosis Date   Anemia    Anemia    Anemia    Fibroid    small  fibroid on utrasound 1/14   Pertussis    as a child   Ventral hernia    Dr Barkley Bruns    There are no problems to display for this patient.   Past Surgical History:  Procedure Laterality Date   WISDOM TOOTH EXTRACTION       OB History   No obstetric history on file.     Family History  Problem Relation Age of Onset   Breast cancer Maternal Aunt 51   Hypertension Sister    Hypertension Brother     Social History   Tobacco Use   Smoking status: Never   Smokeless tobacco: Never  Substance Use Topics   Alcohol use: No   Drug use: No    Home Medications Prior to Admission medications   Medication Sig Start Date End Date Taking? Authorizing Provider  famotidine (PEPCID) 20 MG tablet Take 1 tablet (20 mg total) by mouth 2 (two) times daily. 11/18/21  Yes Regan Lemming, MD    Allergies    Codeine and Hydrocodone  Review of Systems   Review of Systems  Constitutional:   Negative for chills and fever.  Respiratory:  Negative for cough and shortness of breath.   Cardiovascular:  Positive for chest pain. Negative for palpitations.  Gastrointestinal:  Negative for abdominal pain, nausea and vomiting.  Neurological:  Negative for syncope.  All other systems reviewed and are negative.  Physical Exam Updated Vital Signs BP 135/76   Pulse 81   Temp 98.5 F (36.9 C) (Oral)   Resp 16   Ht 5\' 4"  (1.626 m)   Wt 86.2 kg   SpO2 100%   BMI 32.61 kg/m   Physical Exam Vitals and nursing note reviewed.  Constitutional:      General: She is not in acute distress.    Appearance: She is well-developed.  HENT:     Head: Normocephalic and atraumatic.  Eyes:     Conjunctiva/sclera: Conjunctivae normal.  Neck:     Vascular: No JVD.  Cardiovascular:     Rate and Rhythm: Normal rate and regular rhythm.     Heart sounds: No murmur heard. Pulmonary:     Effort: Pulmonary effort is normal. No respiratory distress.     Breath sounds: Normal breath sounds.  Abdominal:     Palpations: Abdomen is soft.     Tenderness: There is no abdominal tenderness.  Musculoskeletal:        General: No swelling.  Cervical back: Neck supple.     Right lower leg: No edema.     Left lower leg: No edema.  Skin:    General: Skin is warm and dry.     Capillary Refill: Capillary refill takes less than 2 seconds.  Neurological:     General: No focal deficit present.     Mental Status: She is alert and oriented to person, place, and time.  Psychiatric:        Mood and Affect: Mood normal.    ED Results / Procedures / Treatments   Labs (all labs ordered are listed, but only abnormal results are displayed) Labs Reviewed  CBC - Abnormal; Notable for the following components:      Result Value   Hemoglobin 11.6 (*)    MCH 25.8 (*)    All other components within normal limits  BASIC METABOLIC PANEL  TROPONIN I (HIGH SENSITIVITY)  TROPONIN I (HIGH SENSITIVITY)    EKG EKG  Interpretation  Date/Time:  Saturday November 18 2021 10:45:36 EST Ventricular Rate:  78 PR Interval:  140 QRS Duration: 84 QT Interval:  382 QTC Calculation: 435 R Axis:   3 Text Interpretation: Normal sinus rhythm Normal ECG Confirmed by Regan Lemming ([id number]) on 11/18/2021 11:33:32 AM  Radiology DG Chest Port 1 View  Result Date: 11/18/2021 CLINICAL DATA:  LEFT side chest and arm pain for 1 day, nonsmoker EXAM: PORTABLE CHEST 1 VIEW COMPARISON:  Portable exam 1124 hours compared to 01/13/2021 FINDINGS: Normal heart size, mediastinal contours, and pulmonary vascularity. Minimal RIGHT basilar atelectasis. Lungs otherwise clear. No pulmonary infiltrate, pleural effusion, or pneumothorax. Osseous structures unremarkable. IMPRESSION: Minimal RIGHT basilar atelectasis. Electronically Signed   By: Lavonia Dana M.D.   On: 11/18/2021 11:57    Procedures Procedures   Medications Ordered in ED Medications - No data to display  ED Course  I have reviewed the triage vital signs and the nursing notes.  Pertinent labs & imaging results that were available during my care of the patient were reviewed by me and considered in my medical decision making (see chart for details).    MDM Rules/Calculators/A&P HEAR Score: 2                         55 year old female with medical history significant for anemia presenting to the emergency department with chest pain that began approximately midnight last night.  The patient was preparing for bedtime when she endorsed epigastric/left-sided chest discomfort with some left arm tightness.  She denied any nausea, vomiting, lightheadedness.  She denied any palpitations.  She denies any shortness of breath.  Symptoms persisted this morning so she presented to the emergency department for further evaluation.  She denies any infectious symptoms.  Of note, 5 days ago the patient tested negative for COVID-19 and influenza by PCR.  She previously been evaluated 1 year  ago in the emergency department with a negative cardiac work-up and was diagnosed with possible GERD and discharged on Pepcid.  She was seen by cardiology on 01/18/2021 during which time her cardiologist felt that her symptoms were most consistent with GERD.  On arrival, the patient was afebrile, hemodynamically stable, not tachycardic or tachypneic, saturating 99% on room air.  Initial EKG revealed normal sinus rhythm, ventricular rate 78, PR 140, QTc 435, no ischemic changes.  The patient is a hear score of 2.  She has no shortness of breath or pleuritic component to her discomfort.  Low suspicion  for acute PE.  Given her low risk hear score, we will evaluate further with delta troponins and reassess.  A chest x-ray was performed which revealed minimal right basilar atelectasis but otherwise no acute cardiac or pulmonary abnormality.  Delta troponins resulted negative.  The patient had a CBC without a leukocytosis, mild anemia to 11.6, no platelet abnormality, BMP unremarkable.  Overall feel  that patient's symptoms are most consistent with gastroesophageal reflux.  Will trial Pepcid twice daily and follow-up with the patient's PCP.  Informed patient of negative work-up and reassuring findings.  DC instructions provided: Your exam/testing today was overall reassuring.  Your cardiac troponins were both negative and your EKG was reassuring.  Your chest x-ray showed mild atelectasis on the right but no acute cardiac or pulmonary abnormalities.  You are risk factors are low for an adverse cardiac event in the next 30 days.  Recommend you follow-up outpatient as needed with your cardiologist for cardiac stress testing as indicated.  Symptoms may be due to GERD.  Will trial Pepcid and have you follow-up with your PCP  Final Clinical Impression(s) / ED Diagnoses Final diagnoses:  Chest pain, unspecified type    Rx / DC Orders ED Discharge Orders          Ordered    famotidine (PEPCID) 20 MG tablet  2 times  daily        11/18/21 1439             Regan Lemming, MD 11/19/21 1954    Regan Lemming, MD 11/19/21 1954

## 2022-06-11 ENCOUNTER — Other Ambulatory Visit: Payer: Self-pay | Admitting: Family Medicine

## 2022-06-11 DIAGNOSIS — Z1231 Encounter for screening mammogram for malignant neoplasm of breast: Secondary | ICD-10-CM

## 2022-07-03 ENCOUNTER — Ambulatory Visit
Admission: RE | Admit: 2022-07-03 | Discharge: 2022-07-03 | Disposition: A | Discharge status: Home / Self Care | Payer: 59 | Source: Ambulatory Visit | Attending: Family Medicine | Admitting: Family Medicine

## 2022-07-03 DIAGNOSIS — Z1231 Encounter for screening mammogram for malignant neoplasm of breast: Secondary | ICD-10-CM

## 2023-06-18 ENCOUNTER — Other Ambulatory Visit: Payer: Self-pay | Admitting: Family Medicine

## 2023-06-18 DIAGNOSIS — Z0000 Encounter for general adult medical examination without abnormal findings: Secondary | ICD-10-CM

## 2023-07-17 ENCOUNTER — Ambulatory Visit
Admission: RE | Admit: 2023-07-17 | Discharge: 2023-07-17 | Disposition: A | Discharge status: Home / Self Care | Payer: 59 | Source: Ambulatory Visit | Attending: Family Medicine | Admitting: Family Medicine

## 2023-07-17 ENCOUNTER — Telehealth: Payer: Self-pay | Admitting: Internal Medicine

## 2023-07-17 DIAGNOSIS — Z0000 Encounter for general adult medical examination without abnormal findings: Secondary | ICD-10-CM

## 2023-07-17 NOTE — Telephone Encounter (Signed)
PT is calling to schedule a colonoscopy with Dr. Leonides Schanz. She has previous GI hx with [facility]. Last procedure done almost 6 years ago. Will be coming to a medical release for records; her doctor has retired.

## 2023-07-18 ENCOUNTER — Ambulatory Visit: Payer: 59

## 2023-07-18 ENCOUNTER — Ambulatory Visit (INDEPENDENT_AMBULATORY_CARE_PROVIDER_SITE_OTHER): Payer: 59 | Admitting: Otolaryngology

## 2023-07-18 ENCOUNTER — Encounter (INDEPENDENT_AMBULATORY_CARE_PROVIDER_SITE_OTHER): Payer: Self-pay | Admitting: Otolaryngology

## 2023-07-18 VITALS — BP 138/80 | HR 89 | Ht 64.0 in | Wt 178.0 lb

## 2023-07-18 DIAGNOSIS — H9190 Unspecified hearing loss, unspecified ear: Secondary | ICD-10-CM

## 2023-07-18 DIAGNOSIS — R0981 Nasal congestion: Secondary | ICD-10-CM | POA: Diagnosis not present

## 2023-07-18 DIAGNOSIS — H6123 Impacted cerumen, bilateral: Secondary | ICD-10-CM

## 2023-07-18 DIAGNOSIS — H6993 Unspecified Eustachian tube disorder, bilateral: Secondary | ICD-10-CM | POA: Diagnosis not present

## 2023-07-18 DIAGNOSIS — J302 Other seasonal allergic rhinitis: Secondary | ICD-10-CM

## 2023-07-18 DIAGNOSIS — H9201 Otalgia, right ear: Secondary | ICD-10-CM

## 2023-07-18 DIAGNOSIS — M26623 Arthralgia of bilateral temporomandibular joint: Secondary | ICD-10-CM

## 2023-07-18 NOTE — Progress Notes (Signed)
ENT CONSULT:  Reason for Consult: cerumen impaction    HPI: Gabrielle Berg is an 57 y.o. female with hx of anemia, uterine fibroid and ventral hernia who is here for evaluation of cerumen impaction. She has small ear canals, and here for ear cleaning. She has pain around her right ear. Last ear cleaning years ago. She was seen by PCP for right sided ear discomfort, and had lavage. She did 7 days of Ciprodex, and it helped with the discomfort. She was told she had infected ears at the time she had lavage. She denies frequent ear infections (last one was in 2020). No ear surgeries. She is Allegra-D and Flonase as needed (Fall and Spring when weather changes).  Reports baseline nasal congestion. Her hearing is muffled right now. She does clench her teeth at night.      Past Medical History:  Diagnosis Date   Anemia    Anemia    Anemia    Fibroid    small  fibroid on utrasound 1/14   Pertussis    as a child   Ventral hernia    Dr Purnell Shoemaker    Past Surgical History:  Procedure Laterality Date   WISDOM TOOTH EXTRACTION      Family History  Problem Relation Age of Onset   Breast cancer Maternal Aunt 77   Hypertension Sister    Hypertension Brother     Social History:  reports that she has never smoked. She has never used smokeless tobacco. She reports that she does not drink alcohol and does not use drugs.  Allergies:  Allergies  Allergen Reactions   Codeine Nausea And Vomiting   Hydrocodone Nausea And Vomiting    Medications: I have reviewed the patient's current medications.  The PMH, PSH, Medications, Allergies, and SH were reviewed and updated.  ROS: Constitutional: Negative for fever, weight loss and weight gain. Cardiovascular: Negative for chest pain and dyspnea on exertion. Respiratory: Is not experiencing shortness of breath at rest. Gastrointestinal: Negative for nausea and vomiting. Neurological: Negative for headaches. Psychiatric: The patient is not  nervous/anxious  Blood pressure 138/80, pulse 89, height 5\' 4"  (1.626 m), weight 178 lb (80.7 kg), SpO2 99%.  PHYSICAL EXAM:  Exam: General: Well-developed, well-nourished Communication and Voice: Clear pitch and clarity Respiratory Respiratory effort: Equal inspiration and expiration without stridor Cardiovascular Peripheral Vascular: Warm extremities with equal color/perfusion Eyes: No nystagmus with equal extraocular motion bilaterally Neuro/Psych/Balance: Patient oriented to person, place, and time; Appropriate mood and affect; Gait is intact with no imbalance; Cranial nerves I-XII are intact Head and Face Inspection: Normocephalic and atraumatic without mass or lesion Palpation: Facial skeleton intact without bony stepoffs Salivary Glands: No mass or tenderness Facial Strength: Facial motility symmetric and full bilaterally ENT Pinna: External ear intact and fully developed External canal: Canal is patent with intact skin, narrow ear canals with cerumen removed  Tympanic Membrane: Clear  External Nose: No scar or anatomic deformity Internal Nose: Septum intact and midline. No edema, polyp, or rhinorrhea Lips, Teeth, and gums: Mucosa and teeth intact and viable TMJ: Pain to palpation with full mobility, TTP worse on the left but present b/l Oral cavity/oropharynx: No erythema or exudate, no lesions present Neck Neck and Trachea: Midline trachea without mass or lesion Thyroid: No mass or nodularity Lymphatics: No lymphadenopathy  Procedure: Procedure: Cerumen Removal, Bilateral (CPT [id number])  Diagnosis: cerumen impaction, bilateral   Informed consent: Timeout performed and informed consent was obtained.  Procedure: Operating microscope was employed to evaluate the  ear(s).  Cerumen curette, speculum and suction were employed to clear the cerumen.   Findings: Normal appearing tympanic membrane on the left without perforations, and external canals are normal after removal  of cerumen.No middle ear fluid bilaterally.   Complications: None. Patient tolerated well.       Studies Reviewed:none  Assessment/Plan: Encounter Diagnoses  Name Primary?   Hearing loss, unspecified hearing loss type, unspecified laterality    Bilateral impacted cerumen    Right ear pain Yes   Nasal congestion    Seasonal allergies    Dysfunction of both eustachian tubes    Bilateral temporomandibular joint pain     57 year old female with history of seasonal allergies and pollen spring as well as narrow ear canals previously requiring cerumen removal in the office, last 1 performed years ago, who presents with several weeks of right sided ear pain and sensation of muffled hearing.  Was seen by PCP had lavage of both ears and then was started on Ciprodex ggt, which she did for 1 week.  Her sx are better today, but she has residual right ear discomfort and muffled hearing. Ddx SNHL vs CHL, hearing somewhat better after cerumen removal today. She also had tenderness of TMJ area on exam worse on the left. 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 ETD is a possibility in the setting of hx of allergies. No evidence of ear infection on exam today.   - Flonase and Zyrtec for nasal congestion  - Audio/Tymps  - RTC after testing  -We discussed common causes of ear discomfort including TMJ syndrome and I reviewed with the patient what can make the discomfort better.    Thank you for allowing me to participate in the care of this patient. Please do not hesitate to contact me with any questions or concerns.   Ashok Croon, MD Otolaryngology [facility] Phone: [phone] Fax: [phone]    07/18/2023, 10:57 AM

## 2023-07-18 NOTE — Patient Instructions (Addendum)
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   TMJ (Temporomandibular Joint Syndrome) The temporomandibular (tem-puh-roe-man-DIB-u-lur) joint (TMJ) acts like a sliding hinge, connecting your jawbone to your skull. You have one joint on each side of your jaw. TMJ disorders -- a type of temporomandibular disorder or TMD -- can cause pain in your jaw joint and in the muscles that control jaw movement.  The exact cause of a person's TMJ disorder is often difficult to determine. Your pain may be due to a combination of factors, such as genetics, arthritis or jaw injury. Some people who have jaw pain also tend to clench or grind their teeth (bruxism), although many people habitually clench or grind their teeth and never develop TMJ disorders.  In most cases, the pain and discomfort associated with TMJ disorders is temporary and can be relieved with self-managed care or nonsurgical treatments. This includes stress reduction, softer diet when the pain is present, anti-inflammatory pain medications such as Motrin and warm compresses.

## 2023-07-26 ENCOUNTER — Ambulatory Visit: Payer: 59 | Attending: Family Medicine | Admitting: Audiologist

## 2023-07-26 DIAGNOSIS — H9193 Unspecified hearing loss, bilateral: Secondary | ICD-10-CM | POA: Insufficient documentation

## 2023-07-26 DIAGNOSIS — Z8669 Personal history of other diseases of the nervous system and sense organs: Secondary | ICD-10-CM | POA: Insufficient documentation

## 2023-07-26 NOTE — Procedures (Signed)
  Outpatient Audiology and [facility] [address] [place], [place]  [address] [phone]  AUDIOLOGICAL  EVALUATION  NAME: Gabrielle Berg     DOB:   July 30, 1966      MRN: [id number]                                                                                     DATE: 07/26/2023     REFERENT: Laurann Montana, MD STATUS: Outpatient DIAGNOSIS: Normal Hearing, History of Otitis Media   History: [name] was seen for an audiological evaluation due to concerns for hearing loss after an ear infection. [name] had a bilateral ear infection recently, she just finished her medication. [name] feels the right ear is still a little muffled. Pain is gone bilaterally. [name] has small ear canals. She routinely gets wax removed by Dr. Jearld Fenton. She had a bad ear infection in 2022, and again recently. Tinnitus denied for both ears. No other relevant case history reported.   Evaluation:  Otoscopy showed a partial view of the tympanic membranes with non occluding cerumen present bilaterally Tympanometry results were consistent with normal middle ear function, bilaterally   Audiometric testing was completed using conventional audiometry with supraural transducer. Speech Recognition Thresholds were 10dB in the right ear and 15dB in the left ear. Word Recognition was performed 40dB SL, scored 100% in the right ear and 100% in the left ear. Pure tone thresholds show normal hearing bilaterally.   Results:  The test results were reviewed with [name]. She has excellent hearing bilaterally. Her middle ears are functioning normal today. No indication of lingering infection or fluid.   Recommendations: 1.   No further audiologic testing is needed unless future hearing concerns arise.    28 minutes spent testing and counseling on results.   Ammie Ferrier  Audiologist, Au.D., CCC-A 07/26/2023  8:24 AM  Cc: Laurann Montana, MD

## 2023-08-02 ENCOUNTER — Other Ambulatory Visit: Payer: Self-pay | Admitting: Physician Assistant

## 2023-08-02 DIAGNOSIS — R519 Headache, unspecified: Secondary | ICD-10-CM

## 2023-08-07 ENCOUNTER — Telehealth: Payer: Self-pay | Admitting: Internal Medicine

## 2023-08-07 NOTE — Telephone Encounter (Signed)
Hi Dr. Leonides Schanz,   Patient called to have a colonoscopy done if possible specifically with you. She does have GI history with [facility] and stated her doctor retired. Her records were obtained and scanned into Media for you to review and advise on scheduling.   Thanks

## 2023-08-12 NOTE — Telephone Encounter (Signed)
Reviewed records from [facility]. Okay to schedule for direct colonoscopy in [facility].   Colonoscopy 04/04/18: Diminutive polyp in the proximal ascending colon that was resected with cold biopsy forceps Path: TA

## 2023-08-14 ENCOUNTER — Encounter: Payer: Self-pay | Admitting: Internal Medicine

## 2023-08-14 NOTE — Telephone Encounter (Signed)
Called patient to advise and schedule, she was at work and will call back.

## 2023-08-15 ENCOUNTER — Ambulatory Visit (INDEPENDENT_AMBULATORY_CARE_PROVIDER_SITE_OTHER): Payer: 59 | Admitting: Otolaryngology

## 2023-08-15 ENCOUNTER — Encounter (INDEPENDENT_AMBULATORY_CARE_PROVIDER_SITE_OTHER): Payer: Self-pay | Admitting: Otolaryngology

## 2023-08-15 VITALS — BP 124/88 | HR 78

## 2023-08-15 DIAGNOSIS — J302 Other seasonal allergic rhinitis: Secondary | ICD-10-CM

## 2023-08-15 DIAGNOSIS — H61309 Acquired stenosis of external ear canal, unspecified, unspecified ear: Secondary | ICD-10-CM

## 2023-08-15 DIAGNOSIS — M26623 Arthralgia of bilateral temporomandibular joint: Secondary | ICD-10-CM

## 2023-08-15 DIAGNOSIS — R0981 Nasal congestion: Secondary | ICD-10-CM | POA: Diagnosis not present

## 2023-08-15 DIAGNOSIS — H6993 Unspecified Eustachian tube disorder, bilateral: Secondary | ICD-10-CM | POA: Diagnosis not present

## 2023-08-15 NOTE — Progress Notes (Signed)
ENT PROGRESS:  Update 08/15/23: Audiogram and Tymps were normal. She reports overall her sx are better, and she feels hearing is at baseline, but she had intermittent muffled hearing sensation on the right side. She saw PCP for jaw pain and headache and was advised to get MRI (scheduled 08/20/23). Doing better, using Flonase and antihistamine   Initial HPI:  Reason for Consult: cerumen impaction    HPI: Gabrielle Berg is an 57 y.o. female with hx of anemia, uterine fibroid and ventral hernia who is here for evaluation of cerumen impaction. She has small ear canals, and here for ear cleaning. She has pain around her right ear. Last ear cleaning years ago. She was seen by PCP for right sided ear discomfort, and had lavage. She did 7 days of Ciprodex, and it helped with the discomfort. She was told she had infected ears at the time she had lavage. She denies frequent ear infections (last one was in 2020). No ear surgeries. She is Allegra-D and Flonase as needed (Fall and Spring when weather changes).  Reports baseline nasal congestion. Her hearing is muffled right now. She does clench her teeth at night.      Past Medical History:  Diagnosis Date   Anemia    Anemia    Anemia    Fibroid    small  fibroid on utrasound 1/14   Pertussis    as a child   Ventral hernia    Dr Purnell Shoemaker    Past Surgical History:  Procedure Laterality Date   WISDOM TOOTH EXTRACTION      Family History  Problem Relation Age of Onset   Breast cancer Maternal Aunt 77   Hypertension Sister    Hypertension Brother     Social History:  reports that she has never smoked. She has never used smokeless tobacco. She reports that she does not drink alcohol and does not use drugs.  Allergies:  Allergies  Allergen Reactions   Codeine Nausea And Vomiting   Hydrocodone Nausea And Vomiting    Medications: I have reviewed the patient's current medications.  The PMH, PSH, Medications, Allergies, and SH were reviewed  and updated.  ROS: Constitutional: Negative for fever, weight loss and weight gain. Cardiovascular: Negative for chest pain and dyspnea on exertion. Respiratory: Is not experiencing shortness of breath at rest. Gastrointestinal: Negative for nausea and vomiting. Neurological: Negative for headaches. Psychiatric: The patient is not nervous/anxious  Blood pressure 124/88, pulse 78, SpO2 97%.  PHYSICAL EXAM:  Exam: General: Well-developed, well-nourished Communication and Voice: Clear pitch and clarity Respiratory Respiratory effort: Equal inspiration and expiration without stridor Cardiovascular Peripheral Vascular: Warm extremities with equal color/perfusion Eyes: No nystagmus with equal extraocular motion bilaterally Neuro/Psych/Balance: Patient oriented to person, place, and time; Appropriate mood and affect; Gait is intact with no imbalance; Cranial nerves I-XII are intact Head and Face Inspection: Normocephalic and atraumatic without mass or lesion Facial Strength: Facial motility symmetric and full bilaterally ENT Pinna: External ear intact and fully developed External canal: Canal is patent with intact skin, narrow ear canals Tympanic Membrane: Clear  External Nose: No scar or anatomic deformity Internal Nose: Septum intact and midline. No edema, polyp, or rhinorrhea Lips, Teeth, and gums: Mucosa and teeth intact and viable TMJ: Pain to palpation with full mobility, TTP worse on the left  Oral cavity/oropharynx: No erythema or exudate, no lesions present Neck Neck and Trachea: Midline trachea without mass or lesion   Procedure:none   Studies Reviewed:none  Assessment/Plan: Encounter Diagnoses  Name Primary?   Nasal congestion Yes   Seasonal allergies    Dysfunction of both eustachian tubes    Bilateral temporomandibular joint pain    Stenosis of external ear canal      57 year old female with history of seasonal allergies and pollen spring as well as narrow  ear canals previously requiring cerumen removal in the office, last 1 performed years ago, who presents with several weeks of right sided ear pain and sensation of muffled hearing.  Was seen by PCP had lavage of both ears and then was started on Ciprodex ggt, which she did for 1 week.  Her sx are better today, but she has residual right ear discomfort and muffled hearing. Ddx SNHL vs CHL, hearing somewhat better after cerumen removal today. She also had tenderness of TMJ area on exam worse on the left. 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 ETD is a possibility in the setting of hx of allergies. No evidence of ear infection on exam today.   - Flonase and Zyrtec for nasal congestion  - Audio/Tymps  - RTC after testing  -We discussed common causes of ear discomfort including TMJ syndrome and I reviewed with the patient what can make the discomfort better.    Update 08/15/23 She had Audiogram and it showed normal hearing and tymps AU. She is getting MRI for headache and jaw pain ordered by PCP. She has residual muffled hearing but overall her sx are better. We discussed eustachian tube dysfunction and importance of continuing with Flonase/Zyrtec. She will return in 6-12 months for routine ear cleaning or sooner if sx return or worsen.  Thank you for allowing me to participate in the care of this patient. Please do not hesitate to contact me with any questions or concerns.   Ashok Croon, MD Otolaryngology [facility] Phone: [phone] Fax: [phone]    08/15/2023, 1:06 PM

## 2023-08-15 NOTE — Patient Instructions (Signed)

## 2023-08-20 ENCOUNTER — Ambulatory Visit: Admission: RE | Admit: 2023-08-20 | Payer: 59 | Source: Ambulatory Visit

## 2023-08-20 DIAGNOSIS — R519 Headache, unspecified: Secondary | ICD-10-CM

## 2023-08-20 MED ORDER — GADOPICLENOL 0.5 MMOL/ML IV SOLN
9.0000 mL | Freq: Once | INTRAVENOUS | Status: AC | PRN
  Administered 2023-08-20: 9 mL via INTRAVENOUS

## 2023-08-28 ENCOUNTER — Encounter: Payer: Self-pay | Admitting: Neurology

## 2023-09-06 ENCOUNTER — Ambulatory Visit (AMBULATORY_SURGERY_CENTER): Payer: 59 | Admitting: *Deleted

## 2023-09-06 VITALS — Ht 64.0 in | Wt 183.0 lb

## 2023-09-06 DIAGNOSIS — Z86010 Personal history of colonic polyps: Secondary | ICD-10-CM

## 2023-09-06 MED ORDER — NA SULFATE-K SULFATE-MG SULF 17.5-3.13-1.6 GM/177ML PO SOLN: 1.0000 | Freq: Once | ORAL | 0 refills | Status: AC

## 2023-09-06 NOTE — Progress Notes (Signed)
Pt's name and DOB verified at the beginning of the pre-visit.  Pt denies any difficulty with ambulating,sitting, laying down or rolling side to side Gave both [facility] main # and MD on call # prior to instructions.  No egg or soy allergy known to patient  No issues known to pt with past sedation with any surgeries or proceduresd Patient denies ever being intubated Pt has no issues moving head neck or swallowing No FH of Malignant Hyperthermia Pt is not on diet pills Pt is not on home 02  Pt is not on blood thinners  Pt denies issues with constipation  Pt is not on dialysis Pt denise any abnormal heart rhythms  Pt denies any upcoming cardiac testing Pt encouraged to use to use Singlecare or Goodrx to reduce cost  Patient's chart reviewed by Cathlyn Parsons CNRA prior to pre-visit and patient appropriate for the [facility].  Pre-visit completed and red dot placed by patient's name on their procedure day (on provider's schedule).  . Visit by phone Pt states weight is 183 lb Instructed pt why it is important to and  to call if they have any changes in health or new medications. Directed them to the # given and on instructions.   Pt states they will.  Instructions reviewed with pt and pt states understanding. Instructed to review again prior to procedure. Pt states they will.  Instructions sent by mail with coupon and by my chart

## 2023-09-16 ENCOUNTER — Ambulatory Visit: Payer: 59 | Admitting: Neurology

## 2023-09-24 ENCOUNTER — Encounter: Payer: Self-pay | Admitting: Internal Medicine

## 2023-09-24 ENCOUNTER — Ambulatory Visit: Payer: 59 | Admitting: Neurology

## 2023-09-30 ENCOUNTER — Ambulatory Visit: Payer: 59 | Admitting: Neurology

## 2023-10-01 ENCOUNTER — Encounter: Payer: Self-pay | Admitting: Internal Medicine

## 2023-10-01 ENCOUNTER — Ambulatory Visit (AMBULATORY_SURGERY_CENTER): Payer: 59 | Admitting: Internal Medicine

## 2023-10-01 VITALS — BP 132/76 | HR 76 | Temp 97.3°F | Resp 18 | Ht 64.0 in | Wt 183.0 lb

## 2023-10-01 DIAGNOSIS — Z09 Encounter for follow-up examination after completed treatment for conditions other than malignant neoplasm: Secondary | ICD-10-CM | POA: Diagnosis present

## 2023-10-01 DIAGNOSIS — Z860100 Personal history of colon polyps, unspecified: Secondary | ICD-10-CM | POA: Diagnosis not present

## 2023-10-01 MED ORDER — SODIUM CHLORIDE 0.9 % IV SOLN: 500.0000 mL | Freq: Once | INTRAVENOUS | Status: DC

## 2023-10-01 NOTE — Op Note (Signed)
Endoscopy Center Patient Name: Gabrielle Berg Procedure Date: 10/01/2023 9:01 AM MRN: [id number] Endoscopist: Madelyn Brunner [place] , , [id number] Age: 57 Referring MD:  Date of Birth: [birth year] Gender: Female Account #: [id number] Procedure:                Colonoscopy Indications:              High risk colon cancer surveillance: Personal                            history of colonic polyps Medicines:                Monitored Anesthesia Care Procedure:                Pre-Anesthesia Assessment:                           - Prior to the procedure, a History and Physical                            was performed, and patient medications and                            allergies were reviewed. The patient's tolerance of                            previous anesthesia was also reviewed. The risks                            and benefits of the procedure and the sedation                            options and risks were discussed with the patient.                            All questions were answered, and informed consent                            was obtained. Prior Anticoagulants: The patient has                            taken no anticoagulant or antiplatelet agents. ASA                            Grade Assessment: II - A patient with mild systemic                            disease. After reviewing the risks and benefits,                            the patient was deemed in satisfactory condition to                            undergo the procedure.  After obtaining informed consent, the colonoscope                            was passed under direct vision. Throughout the                            procedure, the patient's blood pressure, pulse, and                            oxygen saturations were monitored continuously. The                            Olympus scope [id number] was introduced through the                            anus and advanced to the the  terminal ileum. The                            colonoscopy was performed without difficulty. The                            patient tolerated the procedure well. The quality                            of the bowel preparation was good. The terminal                            ileum, ileocecal valve, appendiceal orifice, and                            rectum were photographed. Scope In: 9:05:24 AM Scope Out: 9:16:51 AM Scope Withdrawal Time: 0 hours 7 minutes 7 seconds  Total Procedure Duration: 0 hours 11 minutes 27 seconds  Findings:                 The terminal ileum appeared normal.                           Non-bleeding internal hemorrhoids were found during                            retroflexion. Complications:            No immediate complications. Estimated Blood Loss:     Estimated blood loss: none. Impression:               - The examined portion of the ileum was normal.                           - Non-bleeding internal hemorrhoids.                           - No specimens collected. Recommendation:           - Discharge patient to home (with escort).                           -  Repeat colonoscopy in 10 years for screening                            purposes.                           - The findings and recommendations were discussed                            with the patient. Dr Particia Lather "[place]" Leonides Schanz,  10/01/2023 9:21:03 AM

## 2023-10-01 NOTE — Progress Notes (Signed)
No changes from pre-visit.

## 2023-10-01 NOTE — Patient Instructions (Signed)
Resume previous diet. Continue present medications. Await pathology results.   YOU HAD AN ENDOSCOPIC PROCEDURE TODAY AT THE [facility]:   Refer to the procedure report that was given to you for any specific questions about what was found during the examination.  If the procedure report does not answer your questions, please call your gastroenterologist to clarify.  If you requested that your care partner not be given the details of your procedure findings, then the procedure report has been included in a sealed envelope for you to review at your convenience later.  YOU SHOULD EXPECT: Some feelings of bloating in the abdomen. Passage of more gas than usual.  Walking can help get rid of the air that was put into your GI tract during the procedure and reduce the bloating. If you had a lower endoscopy (such as a colonoscopy or flexible sigmoidoscopy) you may notice spotting of blood in your stool or on the toilet paper. If you underwent a bowel prep for your procedure, you may not have a normal bowel movement for a few days.  Please Note:  You might notice some irritation and congestion in your nose or some drainage.  This is from the oxygen used during your procedure.  There is no need for concern and it should clear up in a day or so.  SYMPTOMS TO REPORT IMMEDIATELY:  Following lower endoscopy (colonoscopy or flexible sigmoidoscopy):  Excessive amounts of blood in the stool  Significant tenderness or worsening of abdominal pains  Swelling of the abdomen that is new, acute  Fever of 100F or higher   For urgent or emergent issues, a gastroenterologist can be reached at any hour by calling [phone]. Do not use MyChart messaging for urgent concerns.    DIET:  We do recommend a small meal at first, but then you may proceed to your regular diet.  Drink plenty of fluids but you should avoid alcoholic beverages for 24 hours.  ACTIVITY:  You should plan to take it easy for the  rest of today and you should NOT DRIVE or use heavy machinery until tomorrow (because of the sedation medicines used during the test).    FOLLOW UP: Our staff will call the number listed on your records the next business day following your procedure.  We will call around 7:15- 8:00 am to check on you and address any questions or concerns that you may have regarding the information given to you following your procedure. If we do not reach you, we will leave a message.     If any biopsies were taken you will be contacted by phone or by letter within the next 1-3 weeks.  Please call us at [phone] if you have not heard about the biopsies in 3 weeks.    SIGNATURES/CONFIDENTIALITY: You and/or your care partner have signed paperwork which will be entered into your electronic medical record.  These signatures attest to the fact that that the information above on your After Visit Summary has been reviewed and is understood.  Full responsibility of the confidentiality of this discharge information lies with you and/or your care-partner. 

## 2023-10-01 NOTE — Progress Notes (Signed)
To pacu, VSS. Report to [email or web address] 

## 2023-10-01 NOTE — Progress Notes (Signed)
GASTROENTEROLOGY PROCEDURE H&P NOTE   Primary Care Physician: Laurann Montana, MD    Reason for Procedure:   History of colon polyps  Plan:    Colonoscopy  Patient is appropriate for endoscopic procedure(s) in the ambulatory ([facility]) setting.  The nature of the procedure, as well as the risks, benefits, and alternatives were carefully and thoroughly reviewed with the patient. Ample time for discussion and questions allowed. The patient understood, was satisfied, and agreed to proceed.     HPI: Gabrielle Berg is a 57 y.o. female who presents for colonoscopy for history of colon polyps. Denies blood in stools, changes in bowel habits, or unintentional weight loss. Denies family history of colon cancer.  Colonoscopy 04/04/18: Diminutive polyp in the proximal ascending colon that was resected with cold biopsy forceps Path: TA  Past Medical History:  Diagnosis Date   Anemia    Anemia    Anemia    Fibroid    small  fibroid on utrasound 1/14   Pertussis    as a child   Ventral hernia    Dr Purnell Shoemaker    Past Surgical History:  Procedure Laterality Date   COLONOSCOPY     WISDOM TOOTH EXTRACTION      Prior to Admission medications   Medication Sig Start Date End Date Taking? Authorizing Provider  fexofenadine-pseudoephedrine (ALLEGRA-D 24) 180-240 MG 24 hr tablet Take 1 tablet by mouth daily.    [provider]  fluticasone (FLONASE) 50 MCG/ACT nasal spray Place into both nostrils daily.    [provider]    Current Outpatient Medications  Medication Sig Dispense Refill   fexofenadine-pseudoephedrine (ALLEGRA-D 24) 180-240 MG 24 hr tablet Take 1 tablet by mouth daily.     fluticasone (FLONASE) 50 MCG/ACT nasal spray Place into both nostrils daily.     Current Facility-Administered Medications  Medication Dose Route Frequency Provider Last Rate Last Admin   0.9 %  sodium chloride infusion  500 mL Intravenous Once Imogene Burn, MD        Allergies  as of 10/01/2023 - Review Complete 10/01/2023  Allergen Reaction Noted   Codeine Nausea And Vomiting 09/18/2012   Hydrocodone Nausea And Vomiting 09/18/2012    Family History  Problem Relation Age of Onset   Hypertension Sister    Hypertension Brother    Breast cancer Maternal Aunt 103   Colon polyps Neg Hx    Colon cancer Neg Hx    Esophageal cancer Neg Hx    Rectal cancer Neg Hx    Stomach cancer Neg Hx     Social History   Socioeconomic History   Marital status: Married    Spouse name: Not on file   Number of children: 3   Years of education: Not on file   Highest education level: Not on file  Occupational History   Occupation: Publishing rights manager   Tobacco Use   Smoking status: Never   Smokeless tobacco: Never  Substance and Sexual Activity   Alcohol use: No   Drug use: No   Sexual activity: Yes    Birth control/protection: Post-menopausal  Other Topics Concern   Not on file  Social History Narrative   Not on file   Social Determinants of Health   Financial Resource Strain: Not on file  Food Insecurity: Not on file  Transportation Needs: Not on file  Physical Activity: Not on file  Stress: Not on file  Social Connections: Not on file  Intimate Partner Violence: Not on file  Physical Exam: Vital signs in last 24 hours: BP 138/80   Pulse 79   Temp (!) 97.3 F (36.3 C) (Temporal)   Ht 5\' 4"  (1.626 m)   Wt 183 lb (83 kg)   SpO2 97%   BMI 31.41 kg/m  GEN: NAD EYE: Sclerae anicteric ENT: MMM CV: Non-tachycardic Pulm: No increased work of breathing GI: Soft, NT/ND NEURO:  Alert & Oriented   Eulah Pont, MD [facility]  10/01/2023 8:22 AM

## 2023-10-02 ENCOUNTER — Telehealth: Payer: Self-pay

## 2023-10-02 NOTE — Telephone Encounter (Signed)
  Follow up Call-     10/01/2023    8:16 AM  Call back number  Post procedure Call Back phone  # [phone]  Permission to leave phone message Yes     Patient questions:  Do you have a fever, pain , or abdominal swelling? No. Pain Score  0 *  Have you tolerated food without any problems? Yes.    Have you been able to return to your normal activities? Yes.    Do you have any questions about your discharge instructions: Diet   No. Medications  No. Follow up visit  No.  Do you have questions or concerns about your Care? No.  Actions: * If pain score is 4 or above: No action needed, pain <4.

## 2023-10-15 ENCOUNTER — Ambulatory Visit (INDEPENDENT_AMBULATORY_CARE_PROVIDER_SITE_OTHER): Payer: Managed Care, Other (non HMO) | Admitting: Neurology

## 2023-10-15 ENCOUNTER — Encounter: Payer: Self-pay | Admitting: Neurology

## 2023-10-15 VITALS — BP 136/82 | HR 88 | Ht 64.0 in | Wt 186.0 lb

## 2023-10-15 DIAGNOSIS — R200 Anesthesia of skin: Secondary | ICD-10-CM

## 2023-10-15 NOTE — Patient Instructions (Signed)
It was lovely to meet you today.  Your neurological exam looks great and MRI does not show anything worrisome.   All the best with your future endeavors!

## 2023-10-15 NOTE — Progress Notes (Signed)
[facility] Clinic Note - Initial Visit   Date: 10/15/2023   Gabrielle Berg MRN: [id number] DOB: 1966/10/03   Dear Dr. Cliffton Asters:  Thank you for your kind referral of Gabrielle Berg for consultation of left facial numbness. Although her history is well known to you, please allow [place] to reiterate it for the purpose of our medical record. The patient was accompanied to the clinic by self.   Gabrielle Berg is a 57 y.o. right-handed female with anemia and seasonal allergies presenting for evaluation of left facial numbness and MRI findings.   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  No evidence of trigeminal neuralgia.  MRI brain was personally viewed which shows partially empty sella which is most likely incidental findings.  She denies headaches or vision changes.  Reassurance provided.  Fortunately, the severity of facial pain has improved and she is back to her baseline.   Return to clinic as needed  ------------------------------------------------------------- History of present illness: In 2004, she saw a dentist who performed a root canal, which got infected. She required regular I&D and antibiotics.  Since this time, she has constant numbness over the left side of the mandible and lip.  She attributed this to possible nerve injury from her dental procedure.  In March 2024, she developed worsening numbness and pain involving the left jaw and cheek.  She was found to have ear infection which was treated with antibiotics.  ENT evaluation was normal.  She had MRI brain showed partially empty sella turica and referred to see me for further evaluation.  She denies headaches or vision changes.  Overall, her symptoms are back to baseline with left cheek numbness.  The pain has resolved.   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     Out-side paper records, electronic medical record, and images have been reviewed where available and summarized as:  MRI brain wwo contrast 08/20/2023: 1. No evidence of an acute intracranial abnormality. 2. Partially empty sella turcica. While this finding often reflects incidental anatomic variation, alternatively it can be associated with idiopathic intracranial hypertension (pseudotumor cerebri). 3. There are a few tiny nonspecific T2 FLAIR hyperintense chronic insults within the cerebral white matter.  4. 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   Past Medical History:  Diagnosis Date   Anemia    Anemia    Anemia    Fibroid    small  fibroid on utrasound 1/14   Pertussis    as a child   Ventral hernia    Dr Purnell Shoemaker    Past Surgical History:  Procedure Laterality Date   COLONOSCOPY     WISDOM TOOTH EXTRACTION       Medications:  Outpatient Encounter Medications as of 10/15/2023  Medication Sig   fexofenadine-pseudoephedrine (ALLEGRA-D 24) 180-240 MG 24 hr tablet Take 1 tablet by mouth daily.   fluticasone (FLONASE) 50 MCG/ACT nasal spray Place into both nostrils daily.   No facility-administered encounter medications on file as of 10/15/2023.    Allergies:  Allergies  Allergen Reactions   Codeine Nausea And Vomiting   Hydrocodone Nausea And Vomiting    Family History: Family History  Problem Relation Age of Onset   Healthy Mother    Hypertension Father    Hypertension Sister    Hypertension Brother    Breast cancer Maternal Aunt  77   Colon polyps Neg Hx    Colon cancer Neg Hx    Esophageal cancer Neg Hx    Rectal cancer Neg Hx    Stomach cancer Neg Hx     Social History: Social History   Tobacco Use   Smoking status: Never   Smokeless tobacco: Never  Substance Use Topics   Alcohol use: No   Drug use: No   Social History   Social History Narrative   Are you right handed or  left handed? Right Handed    Are you currently employed ? Yes   What is your current occupation?   Do you live at home alone? No    Who lives with you? Husband    What type of home do you live in: 1 story or 2 story?     Lives in a two story home    Vital Signs:  BP 136/82   Pulse 88   Ht 5\' 4"  (1.626 m)   Wt 186 lb (84.4 kg)   SpO2 98%   BMI 31.93 kg/m   Neurological Exam: MENTAL STATUS including orientation to time, place, person, recent and remote memory, attention span and concentration, language, and fund of knowledge is normal.  Speech is not dysarthric.  CRANIAL NERVES: II:  No visual field defects.     III-IV-VI: Pupils equal round and reactive to light.  Normal conjugate, extra-ocular eye movements in all directions of gaze.  No nystagmus.  No ptosis.   V:  Normal facial sensation, including over the left V1-V3 distribution. VII:  Normal facial symmetry and movements.   VIII:  Normal hearing and vestibular function.   IX-X:  Normal palatal movement.   XI:  Normal shoulder shrug and head rotation.   XII:  Normal tongue strength and range of motion, no deviation or fasciculation.  MOTOR:  Motor strength is 5/5 throughout.  No atrophy or abnormal movements.  No pronator drift.   MSRs:                                           Right        Left brachioradialis 2+  2+  biceps 2+  2+  triceps 2+  2+  patellar 2+  2+  ankle jerk 2+  2+  Hoffman no  no  plantar response down  down   SENSORY:  Normal and symmetric perception of light touch, pinprick, vibration, and temperature.    COORDINATION/GAIT: Normal finger-to- nose-finger.  Intact rapid alternating movements bilaterally.   Gait narrow based and stable. Tandem and stressed gait intact.      Thank you for allowing me to participate in patient's care.  If I can answer any additional questions, I would be pleased to do so.    Sincerely,    Edee Nifong K. Allena Katz, DO

## 2024-06-29 ENCOUNTER — Other Ambulatory Visit: Payer: Self-pay | Admitting: Family Medicine

## 2024-06-29 DIAGNOSIS — Z1231 Encounter for screening mammogram for malignant neoplasm of breast: Secondary | ICD-10-CM

## 2024-07-17 ENCOUNTER — Ambulatory Visit
Admission: RE | Admit: 2024-07-17 | Discharge: 2024-07-17 | Disposition: A | Discharge status: Home / Self Care | Payer: Self-pay | Source: Ambulatory Visit | Attending: Family Medicine | Admitting: Family Medicine

## 2024-07-17 DIAGNOSIS — Z1231 Encounter for screening mammogram for malignant neoplasm of breast: Secondary | ICD-10-CM
